# Patient Record
Sex: Male | Born: 1970
Health system: Southern US, Community
[De-identification: ages and names within clinical notes are randomized; demographics above are authoritative.]

## PROBLEM LIST (undated history)

## (undated) DIAGNOSIS — F319 Bipolar disorder, unspecified: Secondary | ICD-10-CM

## (undated) DIAGNOSIS — T7840XA Allergy, unspecified, initial encounter: Secondary | ICD-10-CM

## (undated) DIAGNOSIS — I10 Essential (primary) hypertension: Secondary | ICD-10-CM

## (undated) DIAGNOSIS — G4730 Sleep apnea, unspecified: Secondary | ICD-10-CM

## (undated) DIAGNOSIS — F410 Panic disorder [episodic paroxysmal anxiety] without agoraphobia: Secondary | ICD-10-CM

## (undated) HISTORY — DX: Panic disorder (episodic paroxysmal anxiety): F41.0

## (undated) HISTORY — DX: Allergy, unspecified, initial encounter: T78.40XA

## (undated) HISTORY — DX: Bipolar disorder, unspecified: F31.9

## (undated) HISTORY — DX: Essential (primary) hypertension: I10

## (undated) HISTORY — PX: ELBOW SURGERY: SHX618

## (undated) HISTORY — DX: Sleep apnea, unspecified: G47.30

---

## 2000-06-23 HISTORY — PX: SHOULDER SURGERY: SHX246

## 2004-11-22 ENCOUNTER — Ambulatory Visit (HOSPITAL_BASED_OUTPATIENT_CLINIC_OR_DEPARTMENT_OTHER): Admission: RE | Admit: 2004-11-22 | Discharge: 2004-11-22 | Payer: Self-pay | Admitting: Family Medicine

## 2004-11-24 ENCOUNTER — Ambulatory Visit: Payer: Self-pay | Admitting: Internal Medicine

## 2004-12-24 ENCOUNTER — Ambulatory Visit (HOSPITAL_BASED_OUTPATIENT_CLINIC_OR_DEPARTMENT_OTHER): Admission: RE | Admit: 2004-12-24 | Discharge: 2004-12-24 | Payer: Self-pay | Admitting: Family Medicine

## 2005-01-05 ENCOUNTER — Ambulatory Visit: Payer: Self-pay | Admitting: Internal Medicine

## 2013-06-21 ENCOUNTER — Ambulatory Visit (HOSPITAL_COMMUNITY): Payer: Commercial Indemnity | Attending: Cardiovascular Disease | Admitting: Cardiology

## 2013-06-21 ENCOUNTER — Other Ambulatory Visit (HOSPITAL_COMMUNITY): Payer: Self-pay | Admitting: Family Medicine

## 2013-06-21 ENCOUNTER — Encounter: Payer: Self-pay | Admitting: Cardiovascular Disease

## 2013-06-21 DIAGNOSIS — E785 Hyperlipidemia, unspecified: Secondary | ICD-10-CM | POA: Insufficient documentation

## 2013-06-21 DIAGNOSIS — I080 Rheumatic disorders of both mitral and aortic valves: Secondary | ICD-10-CM | POA: Insufficient documentation

## 2013-06-21 DIAGNOSIS — I079 Rheumatic tricuspid valve disease, unspecified: Secondary | ICD-10-CM | POA: Insufficient documentation

## 2013-06-21 DIAGNOSIS — I517 Cardiomegaly: Secondary | ICD-10-CM

## 2013-06-21 DIAGNOSIS — I10 Essential (primary) hypertension: Secondary | ICD-10-CM | POA: Insufficient documentation

## 2013-06-21 NOTE — Progress Notes (Signed)
Echo performed. 

## 2015-10-09 DIAGNOSIS — H00029 Hordeolum internum unspecified eye, unspecified eyelid: Secondary | ICD-10-CM | POA: Diagnosis not present

## 2016-03-03 DIAGNOSIS — M25521 Pain in right elbow: Secondary | ICD-10-CM | POA: Diagnosis not present

## 2016-03-03 DIAGNOSIS — M7021 Olecranon bursitis, right elbow: Secondary | ICD-10-CM | POA: Diagnosis not present

## 2016-04-24 DIAGNOSIS — M25551 Pain in right hip: Secondary | ICD-10-CM | POA: Diagnosis not present

## 2016-04-24 DIAGNOSIS — Z6835 Body mass index (BMI) 35.0-35.9, adult: Secondary | ICD-10-CM | POA: Diagnosis not present

## 2016-04-24 DIAGNOSIS — E669 Obesity, unspecified: Secondary | ICD-10-CM | POA: Diagnosis not present

## 2016-04-24 DIAGNOSIS — Z79899 Other long term (current) drug therapy: Secondary | ICD-10-CM | POA: Diagnosis not present

## 2016-04-24 DIAGNOSIS — M5431 Sciatica, right side: Secondary | ICD-10-CM | POA: Diagnosis not present

## 2016-04-24 DIAGNOSIS — M25721 Osteophyte, right elbow: Secondary | ICD-10-CM | POA: Diagnosis not present

## 2016-04-24 DIAGNOSIS — I10 Essential (primary) hypertension: Secondary | ICD-10-CM | POA: Diagnosis not present

## 2016-04-24 DIAGNOSIS — M7581 Other shoulder lesions, right shoulder: Secondary | ICD-10-CM | POA: Diagnosis not present

## 2016-04-24 DIAGNOSIS — M778 Other enthesopathies, not elsewhere classified: Secondary | ICD-10-CM | POA: Diagnosis not present

## 2016-04-24 DIAGNOSIS — M899 Disorder of bone, unspecified: Secondary | ICD-10-CM | POA: Diagnosis not present

## 2016-04-24 DIAGNOSIS — M25511 Pain in right shoulder: Secondary | ICD-10-CM | POA: Diagnosis not present

## 2016-04-24 DIAGNOSIS — S46311D Strain of muscle, fascia and tendon of triceps, right arm, subsequent encounter: Secondary | ICD-10-CM | POA: Diagnosis not present

## 2016-04-24 DIAGNOSIS — M7021 Olecranon bursitis, right elbow: Secondary | ICD-10-CM | POA: Diagnosis not present

## 2016-04-24 DIAGNOSIS — S46311A Strain of muscle, fascia and tendon of triceps, right arm, initial encounter: Secondary | ICD-10-CM | POA: Diagnosis not present

## 2016-04-24 DIAGNOSIS — Z9989 Dependence on other enabling machines and devices: Secondary | ICD-10-CM | POA: Diagnosis not present

## 2016-04-24 DIAGNOSIS — G4730 Sleep apnea, unspecified: Secondary | ICD-10-CM | POA: Diagnosis not present

## 2016-05-01 DIAGNOSIS — F3173 Bipolar disorder, in partial remission, most recent episode manic: Secondary | ICD-10-CM | POA: Diagnosis not present

## 2016-08-27 DIAGNOSIS — M25521 Pain in right elbow: Secondary | ICD-10-CM | POA: Diagnosis not present

## 2016-09-12 DIAGNOSIS — E7800 Pure hypercholesterolemia, unspecified: Secondary | ICD-10-CM | POA: Diagnosis not present

## 2016-09-12 DIAGNOSIS — G4733 Obstructive sleep apnea (adult) (pediatric): Secondary | ICD-10-CM | POA: Diagnosis not present

## 2016-09-12 DIAGNOSIS — I10 Essential (primary) hypertension: Secondary | ICD-10-CM | POA: Diagnosis not present

## 2016-09-12 DIAGNOSIS — F319 Bipolar disorder, unspecified: Secondary | ICD-10-CM | POA: Diagnosis not present

## 2016-09-12 DIAGNOSIS — R748 Abnormal levels of other serum enzymes: Secondary | ICD-10-CM | POA: Diagnosis not present

## 2016-09-17 ENCOUNTER — Other Ambulatory Visit: Payer: Self-pay | Admitting: Family Medicine

## 2016-09-17 DIAGNOSIS — R945 Abnormal results of liver function studies: Secondary | ICD-10-CM

## 2016-09-23 ENCOUNTER — Encounter: Payer: Self-pay | Admitting: Cardiovascular Disease

## 2016-09-25 ENCOUNTER — Ambulatory Visit
Admission: RE | Admit: 2016-09-25 | Discharge: 2016-09-25 | Disposition: A | Discharge status: Home / Self Care | Payer: BLUE CROSS/BLUE SHIELD | Source: Ambulatory Visit | Attending: Family Medicine | Admitting: Family Medicine

## 2016-09-25 DIAGNOSIS — K760 Fatty (change of) liver, not elsewhere classified: Secondary | ICD-10-CM | POA: Diagnosis not present

## 2016-09-25 DIAGNOSIS — R945 Abnormal results of liver function studies: Secondary | ICD-10-CM

## 2016-10-09 ENCOUNTER — Ambulatory Visit: Payer: Commercial Indemnity | Admitting: Cardiovascular Disease

## 2016-10-30 ENCOUNTER — Encounter: Payer: Self-pay | Admitting: Cardiovascular Disease

## 2016-11-19 NOTE — Progress Notes (Signed)
Cardiology Office Note   Date:  11/21/2016   ID:  Joseph Kirk, DOB 1971/05/20, MRN [id number]  PCP:  Blair Heys, MD  Cardiologist:   Charlton Haws, MD   Chief Complaint  Patient presents with  . Establish Care      History of Present Illness: Joseph Kirk is a 46 y.o. male who presents for evaluation of CAD. CRF;s HTN and family history  Grandfather with premature CAD Has OSA on CPAP since 2006.  Bipolar.  Notes indicate history  Of LVH   Echo 06/21/13 reviewed:  EF 50-55%   Reviewed echo from 05/2013 done for cardiomegaly EF 50-55% mild MR And mild LAE mild basal septal hypertrophy 14.6 mm   Labs from Dr   Manus Gunning reviewed:  LDL 97 TC 166 Trig: 185 Cr 1.2 K 4.1 AST 52 (39) ALT 143 ( 52)   He does local truck driving. Lifts weights Denies steroid use. Occasional SSCP with lifting that sounds more muscular but is exertional. Pain more sharp in nature and has had it for over a year Father died of CHF. Paternal grandfather had premature CAD. Two brothers and a sister ok  Eats well low carb. Non smoker non drinker  Past Medical History:  Diagnosis Date  . Allergy   . Bipolar disorder (HCC)   . Hypertension   . Panic attacks   . Sleep apnea     Past Surgical History:  Procedure Laterality Date  . ELBOW SURGERY Left   . SHOULDER SURGERY  2002     Current Outpatient Prescriptions  Medication Sig Dispense Refill  . aluminum chloride (HYPERCARE) 20 % external solution Apply topically at bedtime. USE AS DIRECTED    . diltiazem (CARDIZEM CD) 180 MG 24 hr capsule Take 180 mg by mouth daily.    . divalproex (DEPAKOTE ER) 500 MG 24 hr tablet Take 2,500 mg by mouth at bedtime.    Marland Kitchen losartan (COZAAR) 100 MG tablet Take 100 mg by mouth daily.    . quinapril-hydrochlorothiazide (ACCURETIC) 20-12.5 MG tablet Take 1 tablet by mouth daily.    . sildenafil (VIAGRA) 100 MG tablet Take 100 mg by mouth daily as needed. For ED     No current facility-administered medications  for this visit.     Allergies:   Viagra [sildenafil citrate]    Social History:  The patient  reports that he has never smoked. He has never used smokeless tobacco. He reports that he does not drink alcohol or use drugs.   Family History:   Grandfather with premature CAD    ROS:  Please see the history of present illness.   Otherwise, review of systems are positive for none.   All other systems are reviewed and negative.    PHYSICAL EXAM: VS:  BP 130/70   Pulse 68   Ht 6' 0.5" (1.842 m)   Wt 277 lb (125.6 kg)   SpO2 98%   BMI 37.05 kg/m  , BMI Body mass index is 37.05 kg/m. Affect appropriate Healthy:  appears stated age HEENT: normal Neck supple with no adenopathy JVP normal no bruits no thyromegaly Lungs clear with no wheezing and good diaphragmatic motion Heart:  S1/S2 no murmur, no rub, gallop or click PMI normal Abdomen: benighn, BS positve, no tenderness, no AAA no bruit.  No HSM or HJR Distal pulses intact with no bruits No edema Neuro non-focal Skin warm and dry No muscular weakness    EKG:  SR LVH inferolateral T wave inversion  Recent Labs: No results found for requested labs within last 8760 hours.    Lipid Panel No results found for: CHOL, TRIG, HDL, CHOLHDL, VLDL, LDLCALC, LDLDIRECT    Wt Readings from Last 3 Encounters:  11/21/16 277 lb (125.6 kg)      Other studies Reviewed: Additional studies/ records that were reviewed today include: Notes Dr Richardo PriestEhnger old echo ECG And labs .    ASSESSMENT AND PLAN:  1.  Cardiomegaly ? releated to weight lifting and HTN f/u echo known LVH  2. MR mild no murmur on exam echo no need for SBE 3. Family history CAD/Chest pain:  Abnormal ECG best test to risk stratify is Calcium score with cardiac CTA will try to do on new scanner 7/12 as he is large 4. HTN  Well controlled.  Continue current medications and low sodium Dash type diet.   5. Bipolar stable no overt manic episodes  6. Elevated LFT;s  F/u  primary may be important if calcium score is high and needs statin   Current medicines are reviewed at length with the patient today.  The patient does not have concerns regarding medicines.  The following changes have been made:  no change  Labs/ tests ordered today include: Echo Cardiac CTA  Orders Placed This Encounter  Procedures  . CT CORONARY MORPH W/CTA COR W/SCORE W/CA W/CM &/OR WO/CM  . EKG 12-Lead  . ECHOCARDIOGRAM COMPLETE     Disposition:   FU with in a year      Signed, Charlton HawsPeter Robbie Rideaux, MD  11/21/2016 9:30 AM    [facility] [address], [place], [place]  [address] Phone: [phone]; Fax: [phone]

## 2016-11-21 ENCOUNTER — Ambulatory Visit (INDEPENDENT_AMBULATORY_CARE_PROVIDER_SITE_OTHER): Payer: BLUE CROSS/BLUE SHIELD | Admitting: Cardiovascular Disease

## 2016-11-21 ENCOUNTER — Encounter: Payer: Self-pay | Admitting: Cardiovascular Disease

## 2016-11-21 ENCOUNTER — Encounter (INDEPENDENT_AMBULATORY_CARE_PROVIDER_SITE_OTHER): Payer: Self-pay

## 2016-11-21 ENCOUNTER — Other Ambulatory Visit: Payer: Self-pay

## 2016-11-21 VITALS — BP 130/70 | HR 68 | Ht 72.5 in | Wt 277.0 lb

## 2016-11-21 DIAGNOSIS — Z8249 Family history of ischemic heart disease and other diseases of the circulatory system: Secondary | ICD-10-CM

## 2016-11-21 DIAGNOSIS — R9431 Abnormal electrocardiogram [ECG] [EKG]: Secondary | ICD-10-CM

## 2016-11-21 DIAGNOSIS — Z7689 Persons encountering health services in other specified circumstances: Secondary | ICD-10-CM | POA: Diagnosis not present

## 2016-11-21 DIAGNOSIS — R079 Chest pain, unspecified: Secondary | ICD-10-CM

## 2016-11-21 DIAGNOSIS — R0789 Other chest pain: Secondary | ICD-10-CM | POA: Diagnosis not present

## 2016-11-21 DIAGNOSIS — I517 Cardiomegaly: Secondary | ICD-10-CM

## 2016-11-21 NOTE — Progress Notes (Unsigned)
Ordered CT for 01/01/17

## 2016-11-21 NOTE — Patient Instructions (Addendum)
Medication Instructions:  Your physician recommends that you continue on your current medications as directed. Please refer to the Current Medication list given to you today.  Labwork: NONE  Testing/Procedures: Your physician has requested that you have an echocardiogram. Echocardiography is a painless test that uses sound waves to create images of your heart. It provides your doctor with information about the size and shape of your heart and how well your heart's chambers and valves are working. This procedure takes approximately one hour. There are no restrictions for this procedure.  Your physician has requested that you have cardiac CT on 01/01/17 (See Jasmine DecemberSharon at Check Out). Cardiac computed tomography (CT) is a painless test that uses an x-ray machine to take clear, detailed pictures of your heart. For further information please visit [email or web address] Please follow instruction sheet as given.   Follow-Up: Your physician wants you to follow-up in: 12 months with Dr. Eden EmmsNishan. You will receive a reminder letter in the mail two months in advance. If you don't receive a letter, please call our office to schedule the follow-up appointment.   If you need a refill on your cardiac medications before your next appointment, please call your pharmacy.

## 2016-11-28 ENCOUNTER — Encounter: Payer: Self-pay | Admitting: Family Medicine

## 2016-12-04 ENCOUNTER — Ambulatory Visit (HOSPITAL_COMMUNITY): Payer: BLUE CROSS/BLUE SHIELD | Attending: Internal Medicine

## 2016-12-04 ENCOUNTER — Other Ambulatory Visit: Payer: Self-pay

## 2016-12-04 DIAGNOSIS — I051 Rheumatic mitral insufficiency: Secondary | ICD-10-CM | POA: Diagnosis not present

## 2016-12-04 DIAGNOSIS — I517 Cardiomegaly: Secondary | ICD-10-CM | POA: Diagnosis not present

## 2016-12-04 DIAGNOSIS — I5030 Unspecified diastolic (congestive) heart failure: Secondary | ICD-10-CM | POA: Insufficient documentation

## 2016-12-08 ENCOUNTER — Encounter: Payer: Self-pay | Admitting: Cardiovascular Disease

## 2017-01-01 ENCOUNTER — Ambulatory Visit (HOSPITAL_COMMUNITY)
Admission: RE | Admit: 2017-01-01 | Discharge: 2017-01-01 | Disposition: A | Discharge status: Home / Self Care | Payer: BLUE CROSS/BLUE SHIELD | Source: Ambulatory Visit | Attending: Cardiovascular Disease | Admitting: Cardiovascular Disease

## 2017-01-01 DIAGNOSIS — R9431 Abnormal electrocardiogram [ECG] [EKG]: Secondary | ICD-10-CM | POA: Diagnosis not present

## 2017-01-01 DIAGNOSIS — K760 Fatty (change of) liver, not elsewhere classified: Secondary | ICD-10-CM | POA: Insufficient documentation

## 2017-01-01 DIAGNOSIS — R079 Chest pain, unspecified: Secondary | ICD-10-CM | POA: Diagnosis not present

## 2017-01-01 MED ORDER — IOPAMIDOL (ISOVUE-370) INJECTION 76%
INTRAVENOUS | Status: AC
Start: 2017-01-01 — End: 2017-01-01
  Administered 2017-01-01: 100 mL
  Filled 2017-01-01: qty 100

## 2017-01-01 MED ORDER — METOPROLOL TARTRATE 5 MG/5ML IV SOLN
INTRAVENOUS | Status: AC
Start: 2017-01-01 — End: 2017-01-01
  Administered 2017-01-01: 5 mg
  Filled 2017-01-01: qty 5

## 2017-01-01 MED ORDER — METOPROLOL TARTRATE 5 MG/5ML IV SOLN
INTRAVENOUS | Status: AC
  Administered 2017-01-01: 10 mg
  Filled 2017-01-01: qty 5

## 2017-04-30 DIAGNOSIS — J0140 Acute pansinusitis, unspecified: Secondary | ICD-10-CM | POA: Diagnosis not present

## 2017-06-03 DIAGNOSIS — L03116 Cellulitis of left lower limb: Secondary | ICD-10-CM | POA: Diagnosis not present

## 2017-06-10 DIAGNOSIS — M79645 Pain in left finger(s): Secondary | ICD-10-CM | POA: Diagnosis not present

## 2017-06-10 DIAGNOSIS — M79644 Pain in right finger(s): Secondary | ICD-10-CM | POA: Diagnosis not present

## 2017-06-19 DIAGNOSIS — M79645 Pain in left finger(s): Secondary | ICD-10-CM | POA: Diagnosis not present

## 2017-06-19 DIAGNOSIS — M25521 Pain in right elbow: Secondary | ICD-10-CM | POA: Diagnosis not present

## 2017-06-19 DIAGNOSIS — M79644 Pain in right finger(s): Secondary | ICD-10-CM | POA: Diagnosis not present

## 2017-08-17 DIAGNOSIS — R030 Elevated blood-pressure reading, without diagnosis of hypertension: Secondary | ICD-10-CM | POA: Diagnosis not present

## 2017-08-17 DIAGNOSIS — M5431 Sciatica, right side: Secondary | ICD-10-CM | POA: Diagnosis not present

## 2017-09-24 DIAGNOSIS — Z125 Encounter for screening for malignant neoplasm of prostate: Secondary | ICD-10-CM | POA: Diagnosis not present

## 2017-09-24 DIAGNOSIS — F319 Bipolar disorder, unspecified: Secondary | ICD-10-CM | POA: Diagnosis not present

## 2017-09-24 DIAGNOSIS — I10 Essential (primary) hypertension: Secondary | ICD-10-CM | POA: Diagnosis not present

## 2017-09-24 DIAGNOSIS — E781 Pure hyperglyceridemia: Secondary | ICD-10-CM | POA: Diagnosis not present

## 2017-09-24 DIAGNOSIS — G4733 Obstructive sleep apnea (adult) (pediatric): Secondary | ICD-10-CM | POA: Diagnosis not present

## 2017-09-24 DIAGNOSIS — N529 Male erectile dysfunction, unspecified: Secondary | ICD-10-CM | POA: Diagnosis not present

## 2018-02-08 IMAGING — CT CT HEART MORP W/ CTA COR W/ SCORE W/ CA W/CM &/OR W/O CM
4 of 7 series · 8 of 20 positions shown, 9 images · IV contrast (APPLIED)
Comparison: No priors.

EXAM:
OVER-READ INTERPRETATION  CT CHEST

The following report is an over-read performed by radiologist Dr.
over-read does not include interpretation of cardiac or coronary
anatomy or pathology. The cardiac CTA interpretation by the
cardiologist is attached.
CLINICAL DATA: Chest pain
Cardiac CTA
MEDICATIONS:
Sub lingual nitro.  4 ug and lopressor 15mg
TECHNIQUE: The patient was scanned on a Siemens Force [REDACTED]ice scanner. Gantry
rotation speed was 250 msecs. Collimation was .6 mm. A 100 kV
prospective scan was triggered in the ascending thoracic aorta at
140 HU's Full mA was used between 35% and 75% of the R-R interval.
Average HR during the scan was 68 bpm. The 3D data set was
interpreted on a dedicated work station using MPR, MIP and VRT
modes. A total of 80cc of contrast was used.

[Series 6: best diast 73 % · axial · 0.41mm/px · z∈[+1762,+1826]mm · 2 of 478 slices shown, 3 images]
[im 160/478  vessel]
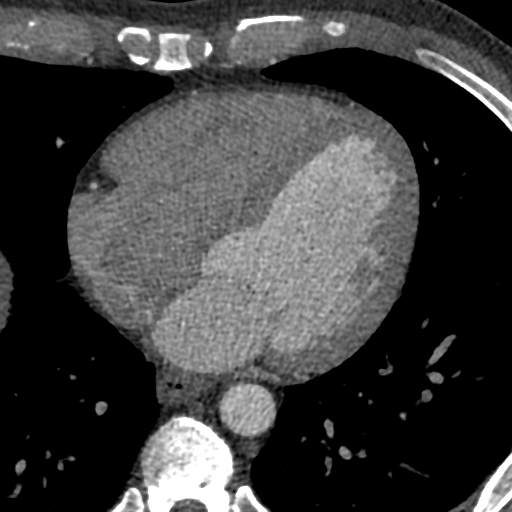
[im 160/478  lung]
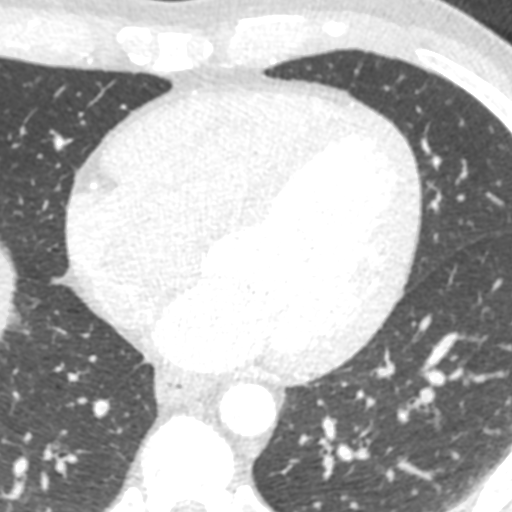
[im 319/478  vessel]
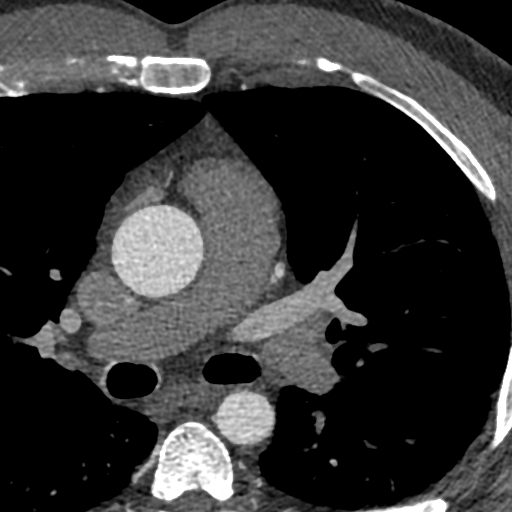

[Series 7: best syst 44 % · axial · 0.41mm/px · z∈[+1762,+1826]mm · 2 of 478 slices shown]
[im 160/478  vessel]
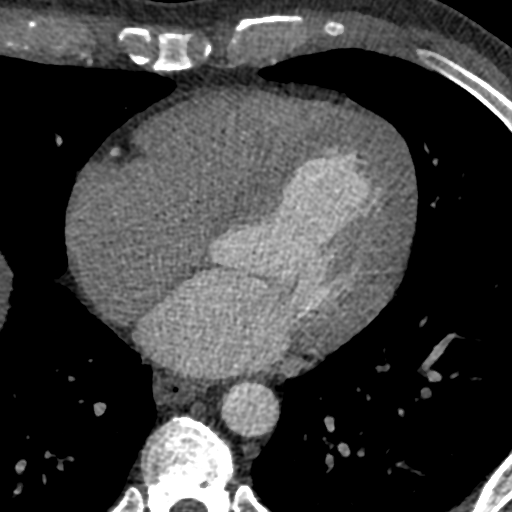
[im 319/478  vessel]
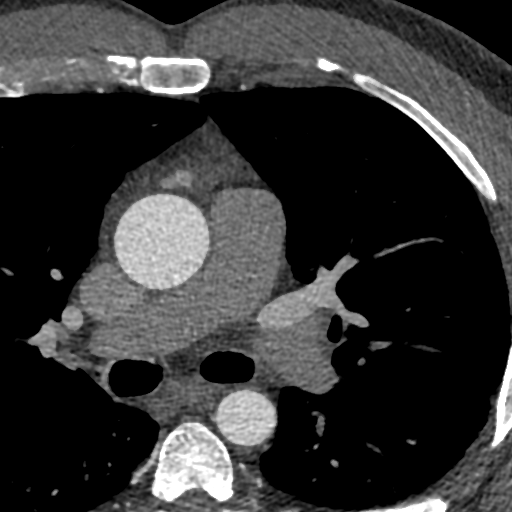

[Series 8: diast sharp 73 % · axial · 0.41mm/px · z∈[+1762,+1826]mm · 2 of 478 slices shown]
[im 160/478  lung]
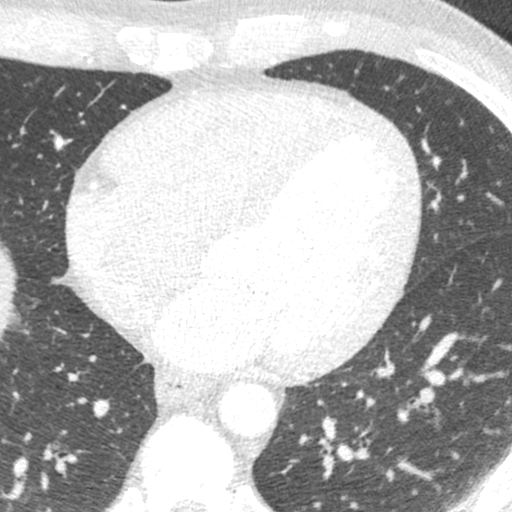
[im 319/478  lung]
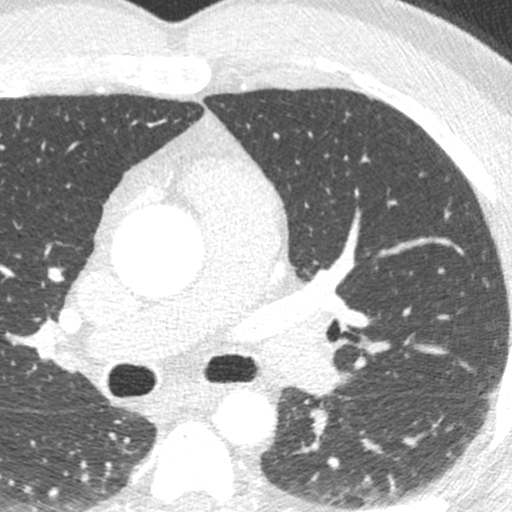

[Series 9: syst sharp 44 % · axial · 0.41mm/px · z∈[+1762,+1826]mm · 2 of 478 slices shown]
[im 160/478  lung]
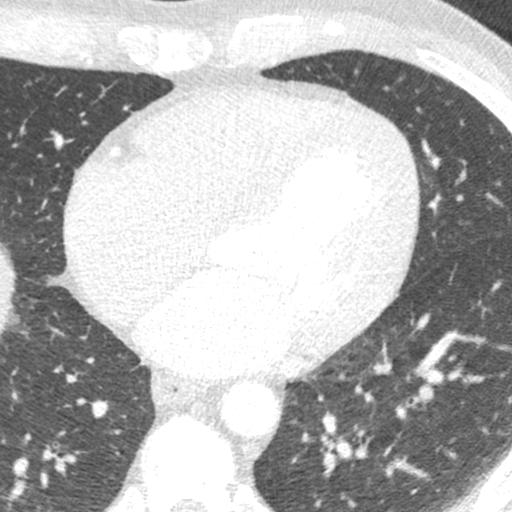
[im 319/478  lung]
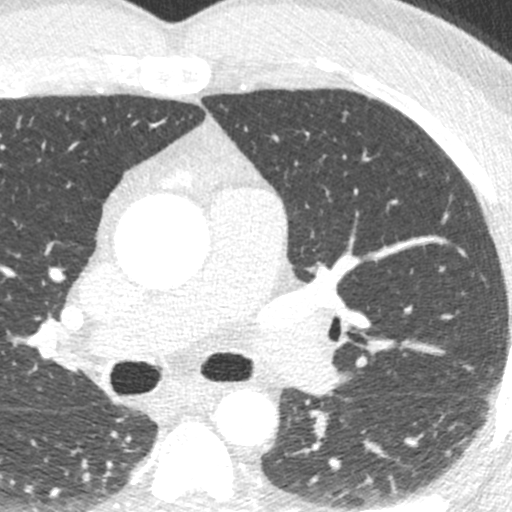

[8 of 20 positions shown; findings below may reference images not displayed]

FINDINGS: Within the visualized portions of the thorax there is no acute
consolidative airspace disease, no pleural effusions, no
pneumothorax, no suspicious-appearing pulmonary nodules or masses
and no lymphadenopathy. Visualized portions of the upper abdomen
demonstrate diffuse low attenuation throughout the visualized liver
indicative of hepatic steatosis. There are no aggressive appearing
lytic or blastic lesions noted in the visualized portions of the
skeleton.
IMPRESSION: 1. Severe hepatic steatosis.
FINDINGS: Non-cardiac: See separate report from [REDACTED]. No
significant findings on limited lung and soft tissue windows.

Calcium Score: 4 small punctate area in mid LAD and distal
circumflex

Coronary Arteries: Right dominant with no anomalies

LM: Normal

LAD:  Less than 20% calcified disease in mid LAD at D1 take off

D1: Normal

IM:  Normal

Circumflex: Normal

OM1: Normal

OM2: Normal

RCA:  Dominant and normal

PDA: Normal

PLA:  Normal
IMPRESSION: 1) Calcium score 4.  79 th percentile for age and sex

2) Essentially normal right dominant coronary arteries with no
significant obstructive disease and mild atherosclerosis of mid LAD
and distal circumflex

Opony Danusia

## 2018-03-03 DIAGNOSIS — H6122 Impacted cerumen, left ear: Secondary | ICD-10-CM | POA: Diagnosis not present

## 2018-05-31 DIAGNOSIS — M79659 Pain in unspecified thigh: Secondary | ICD-10-CM | POA: Diagnosis not present

## 2018-06-02 DIAGNOSIS — M79605 Pain in left leg: Secondary | ICD-10-CM | POA: Diagnosis not present

## 2018-07-05 ENCOUNTER — Encounter: Payer: Self-pay | Admitting: Emergency Medicine

## 2018-07-05 DIAGNOSIS — F319 Bipolar disorder, unspecified: Secondary | ICD-10-CM | POA: Insufficient documentation

## 2018-07-24 DIAGNOSIS — H6120 Impacted cerumen, unspecified ear: Secondary | ICD-10-CM | POA: Diagnosis not present

## 2018-08-13 ENCOUNTER — Ambulatory Visit: Payer: Self-pay | Admitting: Psychiatry

## 2018-08-25 ENCOUNTER — Ambulatory Visit: Payer: Self-pay | Admitting: Psychiatry

## 2018-08-31 ENCOUNTER — Encounter: Payer: Self-pay | Admitting: Psychiatry

## 2018-08-31 ENCOUNTER — Ambulatory Visit: Payer: BLUE CROSS/BLUE SHIELD | Admitting: Psychiatry

## 2018-08-31 DIAGNOSIS — F319 Bipolar disorder, unspecified: Secondary | ICD-10-CM | POA: Diagnosis not present

## 2018-08-31 MED ORDER — DIVALPROEX SODIUM ER 500 MG PO TB24: 2500.0000 mg | ORAL_TABLET | Freq: Every day | ORAL | 3 refills | Status: DC

## 2018-08-31 NOTE — Progress Notes (Signed)
Joseph Kirk [id number] 08-14-1970 48 y.o.  Subjective:   Patient ID:  Joseph Kirk is a 48 y.o. (DOB 1970/11/07) male.  Chief Complaint:  Chief Complaint  Patient presents with  . Follow-up    Medication Management   Last seen February HPI Joseph Kirk presents to the office today for follow-up of bipolar disorder.  Doing fine , same old thing.  No unusual mood swings.  Patient reports stable mood and denies depressed or irritable moods.  Patient denies any recent difficulty with anxiety.  Patient denies difficulty with sleep initiation or maintenance. Denies appetite disturbance.  But can only sleep 6-7 hours.  Patient reports that energy and motivation have been good.  Patient denies any difficulty with concentration.  Patient denies any suicidal ideation.   Review of Systems:  Review of Systems  Neurological: Negative for tremors and weakness.  Psychiatric/Behavioral: Negative for agitation, behavioral problems, confusion, decreased concentration, dysphoric mood, hallucinations, self-injury, sleep disturbance and suicidal ideas. The patient is not nervous/anxious and is not hyperactive.     Medications: I have reviewed the patient's current medications.  Current Outpatient Medications  Medication Sig Dispense Refill  . aluminum chloride (HYPERCARE) 20 % external solution Apply topically at bedtime. USE AS DIRECTED    . diltiazem (CARDIZEM CD) 180 MG 24 hr capsule Take 180 mg by mouth daily.    . divalproex (DEPAKOTE ER) 500 MG 24 hr tablet Take 5 tablets (2,500 mg total) by mouth at bedtime. 450 tablet 3  . losartan (COZAAR) 100 MG tablet Take 100 mg by mouth daily.    . quinapril-hydrochlorothiazide (ACCURETIC) 20-12.5 MG tablet Take 1 tablet by mouth daily.    . sildenafil (VIAGRA) 100 MG tablet Take 100 mg by mouth daily as needed. For ED     No current facility-administered medications for this visit.     Medication Side Effects: None  Allergies:  Allergies   Allergen Reactions  . Viagra [Sildenafil Citrate] Other (See Comments)    HEADACHES    Past Medical History:  Diagnosis Date  . Allergy   . Bipolar disorder (HCC)   . Hypertension   . Panic attacks   . Sleep apnea     Family History  Problem Relation Age of Onset  . Heart disease Father   . Heart failure Father   . Hypertension Father   . Heart disease Paternal Grandfather   . Heart failure Paternal Grandfather   . Heart attack Paternal Grandfather   . Hypertension Paternal Grandfather   . Hypertension Sister     Social History   Socioeconomic History  . Marital status: Single    Spouse name: Not on file  . Number of children: Not on file  . Years of education: Not on file  . Highest education level: Not on file  Occupational History  . Not on file  Social Needs  . Financial resource strain: Not on file  . Food insecurity:    Worry: Not on file    Inability: Not on file  . Transportation needs:    Medical: Not on file    Non-medical: Not on file  Tobacco Use  . Smoking status: Never Smoker  . Smokeless tobacco: Never Used  Substance and Sexual Activity  . Alcohol use: No  . Drug use: No  . Sexual activity: Not on file  Lifestyle  . Physical activity:    Days per week: Not on file    Minutes per session: Not on file  . Stress: Not  on file  Relationships  . Social connections:    Talks on phone: Not on file    Gets together: Not on file    Attends religious service: Not on file    Active member of club or organization: Not on file    Attends meetings of clubs or organizations: Not on file    Relationship status: Not on file  . Intimate partner violence:    Fear of current or ex partner: Not on file    Emotionally abused: Not on file    Physically abused: Not on file    Forced sexual activity: Not on file  Other Topics Concern  . Not on file  Social History Narrative  . Not on file    Past Medical History, Surgical history, Social history, and  Family history were reviewed and updated as appropriate.   Still works FT.  Please see review of systems for further details on the patient's review from today.   Objective:   Physical Exam:  There were no vitals taken for this visit.  Physical Exam Constitutional:      General: He is not in acute distress.    Appearance: He is well-developed.  Musculoskeletal:        General: No deformity.  Neurological:     Mental Status: He is alert and oriented to person, place, and time.     Coordination: Coordination normal.  Psychiatric:        Attention and Perception: Attention normal. He is attentive.        Mood and Affect: Mood normal. Mood is not anxious or depressed. Affect is not labile, blunt, angry or inappropriate.        Speech: Speech normal.        Behavior: Behavior normal.        Thought Content: Thought content normal. Thought content does not include homicidal or suicidal ideation. Thought content does not include homicidal or suicidal plan.        Cognition and Memory: Cognition normal.        Judgment: Judgment normal.     Comments: Insight is good. Animated style.     Lab Review:  No results found for: NA, K, CL, CO2, GLUCOSE, BUN, CREATININE, CALCIUM, PROT, ALBUMIN, AST, ALT, ALKPHOS, BILITOT, GFRNONAA, GFRAA  No results found for: WBC, RBC, HGB, HCT, PLT, MCV, MCH, MCHC, RDW, LYMPHSABS, MONOABS, EOSABS, BASOSABS  No results found for: POCLITH, LITHIUM   No results found for: PHENYTOIN, PHENOBARB, VALPROATE, CBMZ   .res Assessment: Plan:    Bipolar I disorder (HCC)   Brand medically necessary.  Stable for years.  No changes indicated.  Disc SE.  FU 1 years  Meredith Staggers, MD, DFAPA   Please see After Visit Summary for patient specific instructions.  No future appointments.  No orders of the defined types were placed in this encounter.     -------------------------------

## 2018-09-27 DIAGNOSIS — N529 Male erectile dysfunction, unspecified: Secondary | ICD-10-CM | POA: Diagnosis not present

## 2018-09-27 DIAGNOSIS — E781 Pure hyperglyceridemia: Secondary | ICD-10-CM | POA: Diagnosis not present

## 2018-09-27 DIAGNOSIS — G4733 Obstructive sleep apnea (adult) (pediatric): Secondary | ICD-10-CM | POA: Diagnosis not present

## 2018-09-27 DIAGNOSIS — I10 Essential (primary) hypertension: Secondary | ICD-10-CM | POA: Diagnosis not present

## 2018-09-27 DIAGNOSIS — F319 Bipolar disorder, unspecified: Secondary | ICD-10-CM | POA: Diagnosis not present

## 2018-12-04 DIAGNOSIS — F4321 Adjustment disorder with depressed mood: Secondary | ICD-10-CM | POA: Diagnosis not present

## 2018-12-13 ENCOUNTER — Telehealth: Payer: Self-pay | Admitting: Psychiatry

## 2018-12-13 NOTE — Telephone Encounter (Signed)
Patient lost brother and need to get in to see you and he's also having problems being chlostraphobic, please advise pt is off work this week

## 2018-12-13 NOTE — Telephone Encounter (Signed)
We are short staffed at the moment and I apologize.  There is no way I am going to be able to work him in this week.  Obviously grief symptoms are normal at this point.  Does he need my help with something specific?  He may need grief counseling and we can refer him for that if that is what he needs.

## 2018-12-14 NOTE — Telephone Encounter (Signed)
Left voice mail to call back 

## 2018-12-20 DIAGNOSIS — Z03818 Encounter for observation for suspected exposure to other biological agents ruled out: Secondary | ICD-10-CM | POA: Diagnosis not present

## 2018-12-20 DIAGNOSIS — Z20828 Contact with and (suspected) exposure to other viral communicable diseases: Secondary | ICD-10-CM | POA: Diagnosis not present

## 2018-12-20 NOTE — Telephone Encounter (Signed)
I don't know any specific counselors in [place] to recommend.  Hospice might be a good place to start.  RE: masks.  We are not providing letters to excuse mask wearing for public health and liability reasons.  He could consider wearing 1 of the clear face shields with a headband as those are less confining.  Be aware while this is "mandatory" you do not have to have prove of an exception to avoid using a mask.  In other words you are not going to be fined or arrested for not wearing a mask.

## 2018-12-21 NOTE — Telephone Encounter (Signed)
If the patient calls back about these concerns please express that I am sorry for his loss.  We are not giving letters of excuse for the mandate to wear masks.  Individuals can claim to have a mental health reason not to wear a mask and they will not be penalized.  My suggestion is that he use a headband that has the clear shield barrier in front of it.  That generally does not increase a sense of claustrophobia nor anxiety.

## 2018-12-22 NOTE — Telephone Encounter (Signed)
Tried to reach pt a couple times but his mail box is full.

## 2019-01-20 DIAGNOSIS — L719 Rosacea, unspecified: Secondary | ICD-10-CM | POA: Diagnosis not present

## 2019-02-17 DIAGNOSIS — J309 Allergic rhinitis, unspecified: Secondary | ICD-10-CM | POA: Diagnosis not present

## 2019-02-17 DIAGNOSIS — J3489 Other specified disorders of nose and nasal sinuses: Secondary | ICD-10-CM | POA: Diagnosis not present

## 2019-03-03 DIAGNOSIS — L719 Rosacea, unspecified: Secondary | ICD-10-CM | POA: Diagnosis not present

## 2019-04-14 DIAGNOSIS — L719 Rosacea, unspecified: Secondary | ICD-10-CM | POA: Diagnosis not present

## 2019-04-14 DIAGNOSIS — D485 Neoplasm of uncertain behavior of skin: Secondary | ICD-10-CM | POA: Diagnosis not present

## 2019-04-14 DIAGNOSIS — L408 Other psoriasis: Secondary | ICD-10-CM | POA: Diagnosis not present

## 2019-08-31 ENCOUNTER — Encounter: Payer: Self-pay | Admitting: Psychiatry

## 2019-08-31 ENCOUNTER — Ambulatory Visit (INDEPENDENT_AMBULATORY_CARE_PROVIDER_SITE_OTHER): Payer: BC Managed Care – PPO | Admitting: Psychiatry

## 2019-08-31 DIAGNOSIS — F319 Bipolar disorder, unspecified: Secondary | ICD-10-CM

## 2019-08-31 MED ORDER — DIVALPROEX SODIUM ER 500 MG PO TB24
2500.0000 mg | ORAL_TABLET | Freq: Every day | ORAL | 3 refills | Status: DC
Start: 2019-08-31 — End: 2020-06-04

## 2019-08-31 NOTE — Progress Notes (Signed)
Joseph Kirk [id number] 1970-10-07 49 y.o.   Virtual Visit via WebEX  I connected with pt by WebEx and verified that I am speaking with the correct person using two identifiers.   I discussed the limitations, risks, security and privacy concerns of performing an evaluation and management service by [facility] and the availability of in person appointments. I also discussed with the patient that there may be a patient responsible charge related to this service. The patient expressed understanding and agreed to proceed.  I discussed the assessment and treatment plan with the patient. The patient was provided an opportunity to ask questions and all were answered. The patient agreed with the plan and demonstrated an understanding of the instructions.   The patient was advised to call back or seek an in-person evaluation if the symptoms worsen or if the condition fails to improve as anticipated.  I provided 30 minutes of video time during this encounter. The call started at 900 and ended at 9:15. The patient was located at home and the provider was located office.   Subjective:   Patient ID:  Joseph Kirk is a 49 y.o. (DOB 1971-05-24) male.  Chief Complaint:  Chief Complaint  Patient presents with  . Follow-up    Medication Management  . Other    Bipolar 1 disorder  . claustrophobia    re: mask wearing    HPI Joseph Kirk presents to the office today for follow-up of bipolar disorder.   Last seen March 2020.  He was doing well and no meds were changed.  He called back in 2020-06-23shortly after the death of his brother.  Covid outbreak was in progress and he reported being claustrophobic about having to wear a mask.  This was discussed but no meds were changed.  Doing OK now but past year hell.  B was best friend and it's not been good .  B's death was unexpected and healthy person.   Disc grief.  More accepting now than before.  Went through period of crying and may always happen.    Working on gratitude.    Doing fine , same old thing.  No unusual mood swings.  Patient reports stable mood and denies depressed or irritable moods.  Patient denies any recent difficulty with anxiety.  Patient denies difficulty with sleep initiation or maintenance. Denies appetite disturbance.  But can only sleep 6-7 hours.  Patient reports that energy and motivation have been good.  Patient denies any difficulty with concentration.  Patient denies any suicidal ideation.  Pt is truck driver.  Past Psychiatric Medication Trials:  Depakote brand necessary, Lamictal, risperidone  Review of Systems:  Review of Systems  Neurological: Negative for tremors and weakness.  Psychiatric/Behavioral: Negative for agitation, behavioral problems, confusion, decreased concentration, dysphoric mood, hallucinations, self-injury, sleep disturbance and suicidal ideas. The patient is not nervous/anxious and is not hyperactive.     Medications: I have reviewed the patient's current medications.  Current Outpatient Medications  Medication Sig Dispense Refill  . aluminum chloride (HYPERCARE) 20 % external solution Apply topically at bedtime. USE AS DIRECTED    . diltiazem (CARDIZEM CD) 180 MG 24 hr capsule Take 180 mg by mouth daily.    . divalproex (DEPAKOTE ER) 500 MG 24 hr tablet Take 5 tablets (2,500 mg total) by mouth at bedtime. 450 tablet 3  . losartan (COZAAR) 100 MG tablet Take 100 mg by mouth daily.    . quinapril-hydrochlorothiazide (ACCURETIC) 20-12.5 MG tablet Take 1 tablet by mouth  daily.    . sildenafil (VIAGRA) 100 MG tablet Take 100 mg by mouth daily as needed. For ED     No current facility-administered medications for this visit.    Medication Side Effects: None  Allergies:  Allergies  Allergen Reactions  . Viagra [Sildenafil Citrate] Other (See Comments)    HEADACHES    Past Medical History:  Diagnosis Date  . Allergy   . Bipolar disorder ([place])   . Hypertension   . Panic attacks    . Sleep apnea     Family History  Problem Relation Age of Onset  . Heart disease Father   . Heart failure Father   . Hypertension Father   . Heart disease Paternal Grandfather   . Heart failure Paternal Grandfather   . Heart attack Paternal Grandfather   . Hypertension Paternal Grandfather   . Hypertension Sister     Social History   Socioeconomic History  . Marital status: Single    Spouse name: Not on file  . Number of children: Not on file  . Years of education: Not on file  . Highest education level: Not on file  Occupational History  . Not on file  Tobacco Use  . Smoking status: Never Smoker  . Smokeless tobacco: Never Used  Substance and Sexual Activity  . Alcohol use: No  . Drug use: No  . Sexual activity: Not on file  Other Topics Concern  . Not on file  Social History Narrative  . Not on file   Social Determinants of Health   Financial Resource Strain:   . Difficulty of Paying Living Expenses: Not on file  Food Insecurity:   . Worried About Charity fundraiser in the Last Year: Not on file  . Ran Out of Food in the Last Year: Not on file  Transportation Needs:   . Lack of Transportation (Medical): Not on file  . Lack of Transportation (Non-Medical): Not on file  Physical Activity:   . Days of Exercise per Week: Not on file  . Minutes of Exercise per Session: Not on file  Stress:   . Feeling of Stress : Not on file  Social Connections:   . Frequency of Communication with Friends and Family: Not on file  . Frequency of Social Gatherings with Friends and Family: Not on file  . Attends Religious Services: Not on file  . Active Member of Clubs or Organizations: Not on file  . Attends Archivist Meetings: Not on file  . Marital Status: Not on file  Intimate Partner Violence:   . Fear of Current or Ex-Partner: Not on file  . Emotionally Abused: Not on file  . Physically Abused: Not on file  . Sexually Abused: Not on file    Past Medical  History, Surgical history, Social history, and Family history were reviewed and updated as appropriate.   Still works FT.  Please see review of systems for further details on the patient's review from today.   Objective:   Physical Exam:  There were no vitals taken for this visit.  Physical Exam Constitutional:      General: He is not in acute distress.    Appearance: He is well-developed.  Musculoskeletal:        General: No deformity.  Neurological:     Mental Status: He is alert and oriented to person, place, and time.     Coordination: Coordination normal.  Psychiatric:        Attention and  Perception: Attention normal. He is attentive.        Mood and Affect: Mood normal. Mood is not anxious or depressed. Affect is not labile, blunt, angry or inappropriate.        Speech: Speech normal.        Behavior: Behavior normal.        Thought Content: Thought content normal. Thought content does not include homicidal or suicidal ideation. Thought content does not include homicidal or suicidal plan.        Cognition and Memory: Cognition normal.        Judgment: Judgment normal.     Comments: Insight is good. Animated style.     Lab Review:  No results found for: NA, K, CL, CO2, GLUCOSE, BUN, CREATININE, CALCIUM, PROT, ALBUMIN, AST, ALT, ALKPHOS, BILITOT, GFRNONAA, GFRAA  No results found for: WBC, RBC, HGB, HCT, PLT, MCV, MCH, MCHC, RDW, LYMPHSABS, MONOABS, EOSABS, BASOSABS  No results found for: POCLITH, LITHIUM   No results found for: PHENYTOIN, PHENOBARB, VALPROATE, CBMZ   .res Assessment: Plan:    Bipolar I disorder (HCC) - Plan: divalproex (DEPAKOTE ER) 500 MG 24 hr tablet   Brand medically necessary.  Stable for years.  Disc grief issues with brother and shock at his death.    No changes indicated.  Disc SE.  FU 1 years  Meredith Staggers, MD, DFAPA   Please see After Visit Summary for patient specific instructions.  No future appointments.  No orders of  the defined types were placed in this encounter.     -------------------------------

## 2019-11-23 ENCOUNTER — Other Ambulatory Visit: Payer: Self-pay | Admitting: Psychiatry

## 2019-11-23 DIAGNOSIS — F319 Bipolar disorder, unspecified: Secondary | ICD-10-CM

## 2019-11-24 NOTE — Telephone Encounter (Signed)
Should already have on file

## 2019-11-25 NOTE — Telephone Encounter (Signed)
Already sent.

## 2020-02-21 DIAGNOSIS — G4733 Obstructive sleep apnea (adult) (pediatric): Secondary | ICD-10-CM | POA: Diagnosis not present

## 2020-02-21 DIAGNOSIS — I10 Essential (primary) hypertension: Secondary | ICD-10-CM | POA: Diagnosis not present

## 2020-02-21 DIAGNOSIS — F319 Bipolar disorder, unspecified: Secondary | ICD-10-CM | POA: Diagnosis not present

## 2020-02-21 DIAGNOSIS — N529 Male erectile dysfunction, unspecified: Secondary | ICD-10-CM | POA: Diagnosis not present

## 2020-02-29 DIAGNOSIS — E781 Pure hyperglyceridemia: Secondary | ICD-10-CM | POA: Diagnosis not present

## 2020-02-29 DIAGNOSIS — I10 Essential (primary) hypertension: Secondary | ICD-10-CM | POA: Diagnosis not present

## 2020-02-29 DIAGNOSIS — Z125 Encounter for screening for malignant neoplasm of prostate: Secondary | ICD-10-CM | POA: Diagnosis not present

## 2020-05-09 DIAGNOSIS — N529 Male erectile dysfunction, unspecified: Secondary | ICD-10-CM | POA: Diagnosis not present

## 2020-05-21 DIAGNOSIS — S300XXA Contusion of lower back and pelvis, initial encounter: Secondary | ICD-10-CM | POA: Diagnosis not present

## 2020-06-01 ENCOUNTER — Other Ambulatory Visit: Payer: Self-pay | Admitting: Psychiatry

## 2020-06-01 DIAGNOSIS — F319 Bipolar disorder, unspecified: Secondary | ICD-10-CM

## 2020-06-04 ENCOUNTER — Other Ambulatory Visit: Payer: Self-pay | Admitting: Psychiatry

## 2020-06-04 MED ORDER — DIVALPROEX SODIUM 500 MG PO DR TAB: DELAYED_RELEASE_TABLET | ORAL | 0 refills | Status: DC

## 2020-06-12 ENCOUNTER — Ambulatory Visit: Payer: BC Managed Care – PPO | Admitting: Psychiatry

## 2020-06-17 DIAGNOSIS — G5602 Carpal tunnel syndrome, left upper limb: Secondary | ICD-10-CM | POA: Diagnosis not present

## 2020-06-25 ENCOUNTER — Ambulatory Visit: Payer: BC Managed Care – PPO | Admitting: Psychiatry

## 2020-07-03 ENCOUNTER — Ambulatory Visit (INDEPENDENT_AMBULATORY_CARE_PROVIDER_SITE_OTHER): Payer: BC Managed Care – PPO | Admitting: Psychiatry

## 2020-07-03 ENCOUNTER — Encounter: Payer: Self-pay | Admitting: Psychiatry

## 2020-07-03 ENCOUNTER — Other Ambulatory Visit: Payer: Self-pay

## 2020-07-03 DIAGNOSIS — F319 Bipolar disorder, unspecified: Secondary | ICD-10-CM

## 2020-07-03 NOTE — Progress Notes (Signed)
Joseph Kirk [id number] 07-31-1970 50 y.o.     Subjective:   Patient ID:  Joseph Kirk is a 50 y.o. (DOB [birth year]) male.  Chief Complaint:  Chief Complaint  Patient presents with  . Follow-up  . Medication Problem    HPI Joseph Kirk presents to the office today for follow-up of bipolar disorder.   Last seen March 2021.  He was doing well and no meds were changed.  07/04/19 appt noted: Lost B and his dog.  No marriage and no kids.  B left her $100K and it has affected his life.  Paid off his condo and has no debt.  Realizes it does not bring joy.  Weird emotional place in life.  Don't know what to do with his life.  Drives a truck.    Still claustrophobic with masks.  Goes to gym consistently.  Wants to wean off meds and see how he does without it.  Sister advises against it. Problems sexually and thinks meds might be related.  Saw a urologist.  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     Doing fine , same old thing.  No unusual mood swings.  Patient reports stable mood and denies depressed or irritable moods.  Patient denies any recent difficulty with anxiety.  Patient denies difficulty with sleep initiation or maintenance. Denies appetite disturbance.  But can only sleep 6-7 hours.  Patient reports that energy and motivation have been good.  Patient denies any difficulty with concentration.  Patient denies any suicidal ideation.  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   1998 was really depressed.  Has friends and family now that might help him recognize sx.  Don't drink alcohol.   Past Psychiatric Medication Trials:  Depakote brand necessary, Lamictal, risperidone No history of psych hospitalization.  Review of Systems:  Review of Systems  Cardiovascular: Negative for palpitations.  Neurological: Negative for tremors and weakness.  Psychiatric/Behavioral: Negative for agitation, behavioral problems, confusion,  decreased concentration, dysphoric mood, hallucinations, self-injury, sleep disturbance and suicidal ideas. The patient is not nervous/anxious and is not hyperactive.     Medications: I have reviewed the patient's current medications.  Current Outpatient Medications  Medication Sig Dispense Refill  . aluminum chloride (DRYSOL) 20 % external solution Apply topically at bedtime. USE AS DIRECTED    . diltiazem (CARDIZEM CD) 180 MG 24 hr capsule Take 180 mg by mouth daily.    . divalproex (DEPAKOTE) 500 MG DR tablet 2 tablets in the AM and 3 tablets at night 150 tablet 0  . losartan (COZAAR) 100 MG tablet Take 100 mg by mouth daily.    . quinapril-hydrochlorothiazide (ACCURETIC) 20-12.5 MG tablet Take 1 tablet by mouth daily.    . sildenafil (VIAGRA) 100 MG tablet Take 100 mg by mouth daily as needed. For ED     No current facility-administered medications for this visit.    Medication Side Effects: None  Allergies:  Allergies  Allergen Reactions  . Viagra [Sildenafil Citrate] Other (See Comments)    HEADACHES    Past Medical History:  Diagnosis Date  . Allergy   . Bipolar disorder (HCC)   . Hypertension   . Panic attacks   . Sleep apnea     Family History  Problem Relation Age of Onset  . Heart disease Father   . Heart failure Father   . Hypertension Father   . Heart disease  Paternal Grandfather   . Heart failure Paternal Grandfather   . Heart attack Paternal Grandfather   . Hypertension Paternal Grandfather   . Hypertension Sister     Social History   Socioeconomic History  . Marital status: Single    Spouse name: Not on file  . Number of children: Not on file  . Years of education: Not on file  . Highest education level: Not on file  Occupational History  . Not on file  Tobacco Use  . Smoking status: Never Smoker  . Smokeless tobacco: Never Used  Vaping Use  . Vaping Use: Never used  Substance and Sexual Activity  . Alcohol use: No  . Drug use: No  .  Sexual activity: Not on file  Other Topics Concern  . Not on file  Social History Narrative  . Not on file   Social Determinants of Health   Financial Resource Strain: Not on file  Food Insecurity: Not on file  Transportation Needs: Not on file  Physical Activity: Not on file  Stress: Not on file  Social Connections: Not on file  Intimate Partner Violence: Not on file    Past Medical History, Surgical history, Social history, and Family history were reviewed and updated as appropriate.   Still works FT.  Please see review of systems for further details on the patient's review from today.   Objective:   Physical Exam:  There were no vitals taken for this visit.  Physical Exam Constitutional:      General: He is not in acute distress.    Appearance: He is well-developed.  Musculoskeletal:        General: No deformity.  Neurological:     Mental Status: He is alert and oriented to person, place, and time.     Coordination: Coordination normal.  Psychiatric:        Attention and Perception: Attention normal. He is attentive.        Mood and Affect: Mood normal. Mood is not anxious or depressed. Affect is not labile, blunt, angry or inappropriate.        Speech: Speech normal.        Behavior: Behavior normal.        Thought Content: Thought content normal. Thought content does not include homicidal or suicidal ideation. Thought content does not include homicidal or suicidal plan.        Cognition and Memory: Cognition normal.        Judgment: Judgment normal.     Comments: Insight is good. Animated style long term.     Lab Review:  No results found for: NA, K, CL, CO2, GLUCOSE, BUN, CREATININE, CALCIUM, PROT, ALBUMIN, AST, ALT, ALKPHOS, BILITOT, GFRNONAA, GFRAA  No results found for: WBC, RBC, HGB, HCT, PLT, MCV, MCH, MCHC, RDW, LYMPHSABS, MONOABS, EOSABS, BASOSABS  No results found for: POCLITH, LITHIUM   No results found for: PHENYTOIN, PHENOBARB, VALPROATE, CBMZ    .res Assessment: Plan:    Bipolar I disorder (HCC)   Stable for years.   Greater than 50% of face to face time with patient was spent on counseling and coordination of care. Extensive discussion around patient's desire to wean off of mood stabilizer.  He was diagnosed with bipolar disorder years ago by Dr. Tiajuana Amass.  He denies a history of severe manic episodes and specifically has never had psychotic episodes.  He remembers more distinctly episodes of depression.  We discussed the research that supports that often patients with bipolar disorder  do not remember the significance and severity of manic episodes and that they can have significant consequence if they recur.  They can even be life threatening or can cause severe personal hardship through impulsive reckless behavior.  He believes that he can wean off the medication and recognizes manic or hypomanic episodes if they occur and seek treatment if necessary.  He was encouraged and agrees to share with a friend and a family member's desire to wean off medication and will meet with him on a regular basis to observe whether he has any recurrence of mood symptoms as he does so.  Patient was educated about the nature of bipolar disorder and if the diagnosis is accurate statistically he is likely to have recurrent mood episodes if he weans off the Depakote.  He wants to do so regardless of this information.  We discussed specifically his concerns about potential sexual side effects of Depakote which are low but not impossible.  We discussed potential alternative mood stabilizers to Depakote if necessary.  Per his request, he will reduce the Depakote by 500 mg monthly and will contact us if he has any recurrence of symptoms.  We will follow-up towards the end of his taper schedule.  He understands the risk of seizures if he stops abruptly.  Also extensively discussed issues around his inheritance which has relieved him of financial burden  but also created some existential issues around a sense of meaning and purpose and whether he should seek a career change.  His concerns were rational and well presented and he will continue to work on these areas.  Follow-up 3 to 4 months or sooner as needed This was a 40-minute appointment  Meredith Staggers, MD, DFAPA   Please see After Visit Summary for patient specific instructions.  Future Appointments  Date Time Provider Department Center  10/30/2020  9:00 AM Cottle, Steva Ready., MD [facility] None    No orders of the defined types were placed in this encounter.     -------------------------------

## 2020-07-11 DIAGNOSIS — M25532 Pain in left wrist: Secondary | ICD-10-CM | POA: Diagnosis not present

## 2020-07-11 DIAGNOSIS — N529 Male erectile dysfunction, unspecified: Secondary | ICD-10-CM | POA: Diagnosis not present

## 2020-07-11 DIAGNOSIS — M25531 Pain in right wrist: Secondary | ICD-10-CM | POA: Diagnosis not present

## 2020-08-27 ENCOUNTER — Telehealth: Payer: Self-pay

## 2020-08-27 ENCOUNTER — Other Ambulatory Visit: Payer: Self-pay | Admitting: Psychiatry

## 2020-08-27 DIAGNOSIS — F319 Bipolar disorder, unspecified: Secondary | ICD-10-CM

## 2020-08-27 NOTE — Telephone Encounter (Signed)
Check on refill °

## 2020-08-27 NOTE — Telephone Encounter (Signed)
Pt does not need Rx he is no longer on depakote.

## 2020-08-29 NOTE — Telephone Encounter (Signed)
Jola Babinski could you write a brief note that says as of his appointment in January the patient and his physician agreed that he could wean off the Depakote.  He is no longer on any psychiatric medication.

## 2020-08-30 DIAGNOSIS — M25531 Pain in right wrist: Secondary | ICD-10-CM | POA: Diagnosis not present

## 2020-08-30 DIAGNOSIS — M25532 Pain in left wrist: Secondary | ICD-10-CM | POA: Diagnosis not present

## 2020-08-30 NOTE — Telephone Encounter (Signed)
Thank you :)

## 2020-09-03 DIAGNOSIS — M25532 Pain in left wrist: Secondary | ICD-10-CM | POA: Diagnosis not present

## 2020-10-09 DIAGNOSIS — G5602 Carpal tunnel syndrome, left upper limb: Secondary | ICD-10-CM | POA: Diagnosis not present

## 2020-10-30 ENCOUNTER — Other Ambulatory Visit: Payer: Self-pay

## 2020-10-30 ENCOUNTER — Encounter: Payer: Self-pay | Admitting: Psychiatry

## 2020-10-30 ENCOUNTER — Ambulatory Visit (INDEPENDENT_AMBULATORY_CARE_PROVIDER_SITE_OTHER): Payer: BC Managed Care – PPO | Admitting: Psychiatry

## 2020-10-30 DIAGNOSIS — F39 Unspecified mood [affective] disorder: Secondary | ICD-10-CM

## 2020-10-30 NOTE — Progress Notes (Signed)
Cross Jorge [id number] 1970-09-18 50 y.o.     Subjective:   Patient ID:  Joseph Kirk is a 50 y.o. (DOB Dec 02, 1970) male.  Chief Complaint:  Chief Complaint  Patient presents with  . Follow-up  . Bipolar I disorder [facility])    HPI Joseph Kirk presents to the office today for follow-up of bipolar disorder.   seen March 2021.  He was doing well and no meds were changed.  07/03/20 appt noted: Lost B and his dog.  No marriage and no kids.  B left her $100K and it has affected his life.  Paid off his condo and has no debt.  Realizes it does not bring joy.  Weird emotional place in life.  Don't know what to do with his life.  Drives a truck.    Still claustrophobic with masks.  Goes to gym consistently. Wants to wean off meds and see how he does without it.  Sister advises against it. Problems sexually and thinks meds might be related.  Saw a urologist.  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    Plan per his request wean off Depakote  10/30/2020 appt noted: Slowly weaned off med and last night first night without meds.  No mood swings.  Sister aware. Best friend and family knows he weaned off meds.  No concerns from him. Saw urologist.    Still works out a lot. No differences positive or negative off the VPA including sexually. Denies history of mania.   Doing fine , same old thing.  No unusual mood swings.  Patient reports stable mood and denies depressed or irritable moods.  Patient denies any recent difficulty with anxiety.  Patient denies difficulty with sleep initiation or maintenance. Denies appetite disturbance.  But can only sleep 6-7 hours.  Patient reports that energy and motivation have been good.  Patient denies any difficulty with concentration.  Patient denies any suicidal ideation.  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   1998 was really depressed.  Has friends and family now that might help him recognize sx.   Don't drink alcohol.   Past Psychiatric Medication Trials:  Depakote brand necessary, Lamictal, risperidone No history of psych hospitalization.  Review of Systems:  Review of Systems  Neurological: Negative for tremors and weakness.  Psychiatric/Behavioral: Negative for agitation, behavioral problems, confusion, decreased concentration, dysphoric mood, hallucinations, self-injury, sleep disturbance and suicidal ideas. The patient is not nervous/anxious and is not hyperactive.     Medications: I have reviewed the patient's current medications.  Current Outpatient Medications  Medication Sig Dispense Refill  . aluminum chloride (DRYSOL) 20 % external solution Apply topically at bedtime. USE AS DIRECTED    . diltiazem (CARDIZEM CD) 180 MG 24 hr capsule Take 180 mg by mouth daily.    Marland Kitchen losartan (COZAAR) 100 MG tablet Take 100 mg by mouth daily.    . quinapril-hydrochlorothiazide (ACCURETIC) 20-12.5 MG tablet Take 1 tablet by mouth daily.    . sildenafil (VIAGRA) 100 MG tablet Take 100 mg by mouth daily as needed. For ED    . divalproex (DEPAKOTE) 500 MG DR tablet 2 tablets in the AM and 3 tablets at night (Patient not taking: Reported on 10/30/2020) 150 tablet 0   No current facility-administered medications for this visit.    Medication Side Effects: None  Allergies:  Allergies  Allergen Reactions  . Viagra [Sildenafil Citrate] Other (See Comments)  HEADACHES    Past Medical History:  Diagnosis Date  . Allergy   . Bipolar disorder (HCC)   . Hypertension   . Panic attacks   . Sleep apnea     Family History  Problem Relation Age of Onset  . Heart disease Father   . Heart failure Father   . Hypertension Father   . Heart disease Paternal Grandfather   . Heart failure Paternal Grandfather   . Heart attack Paternal Grandfather   . Hypertension Paternal Grandfather   . Hypertension Sister     Social History   Socioeconomic History  . Marital status: Single     Spouse name: Not on file  . Number of children: Not on file  . Years of education: Not on file  . Highest education level: Not on file  Occupational History  . Not on file  Tobacco Use  . Smoking status: Never Smoker  . Smokeless tobacco: Never Used  Vaping Use  . Vaping Use: Never used  Substance and Sexual Activity  . Alcohol use: No  . Drug use: No  . Sexual activity: Not on file  Other Topics Concern  . Not on file  Social History Narrative  . Not on file   Social Determinants of Health   Financial Resource Strain: Not on file  Food Insecurity: Not on file  Transportation Needs: Not on file  Physical Activity: Not on file  Stress: Not on file  Social Connections: Not on file  Intimate Partner Violence: Not on file    Past Medical History, Surgical history, Social history, and Family history were reviewed and updated as appropriate.   Still works FT.  Please see review of systems for further details on the patient's review from today.   Objective:   Physical Exam:  There were no vitals taken for this visit.  Physical Exam Constitutional:      General: He is not in acute distress.    Appearance: He is well-developed.  Musculoskeletal:        General: No deformity.  Neurological:     Mental Status: He is alert and oriented to person, place, and time.     Coordination: Coordination normal.  Psychiatric:        Attention and Perception: Attention normal. He is attentive.        Mood and Affect: Mood normal. Mood is not anxious or depressed. Affect is not labile, blunt, angry or inappropriate.        Speech: Speech normal.        Behavior: Behavior normal.        Thought Content: Thought content normal. Thought content does not include homicidal or suicidal ideation. Thought content does not include homicidal or suicidal plan.        Cognition and Memory: Cognition normal.        Judgment: Judgment normal.     Comments: Insight is good. Animated style long  term. No mania.     Lab Review:  No results found for: NA, K, CL, CO2, GLUCOSE, BUN, CREATININE, CALCIUM, PROT, ALBUMIN, AST, ALT, ALKPHOS, BILITOT, GFRNONAA, GFRAA  No results found for: WBC, RBC, HGB, HCT, PLT, MCV, MCH, MCHC, RDW, LYMPHSABS, MONOABS, EOSABS, BASOSABS  No results found for: POCLITH, LITHIUM   No results found for: PHENYTOIN, PHENOBARB, VALPROATE, CBMZ   .res Assessment: Plan:    Episodic mood disorder (HCC)   Stable for years.   Greater than 50% of face to face time with patient was  spent on counseling and coordination of care. Extensive discussion around patient's desire to wean off of mood stabilizer.  He was diagnosed with bipolar disorder years ago by Dr. Tiajuana Amass.  He denies a history of severe manic episodes and specifically has never had psychotic episodes.  He remembers more distinctly episodes of depression.  We discussed the research that supports that often patients with bipolar disorder do not remember the significance and severity of manic episodes and that they can have significant consequence if they recur.  They can even be life threatening or can cause severe personal hardship through impulsive reckless behavior.  He believes that he can wean off the medication and recognizes manic or hypomanic episodes if they occur and seek treatment if necessary.  He was encouraged and agrees to share with a friend and a family member's desire to wean off medication and will meet with him on a regular basis to observe whether he has any recurrence of mood symptoms as he does so.  He thinks when had sx in past it was situational.  Patient was educated about the nature of bipolar disorder and if the diagnosis is accurate statistically he is likely to have recurrent mood episodes off the Depakote.  He wants to do so regardless of this information.  He's had no problems off the Depakote.  We discussed potential alternative mood stabilizers to Depakote if  necessary.  Follow-up as needed.  Disc signs sx bipolar and depression and call if needed.  Meredith Staggers, MD, DFAPA   Please see After Visit Summary for patient specific instructions.  No future appointments.  No orders of the defined types were placed in this encounter.     -------------------------------

## 2020-11-21 DIAGNOSIS — Z4789 Encounter for other orthopedic aftercare: Secondary | ICD-10-CM | POA: Diagnosis not present

## 2020-11-21 DIAGNOSIS — M65332 Trigger finger, left middle finger: Secondary | ICD-10-CM | POA: Diagnosis not present

## 2020-12-25 DIAGNOSIS — M25532 Pain in left wrist: Secondary | ICD-10-CM | POA: Diagnosis not present

## 2020-12-25 DIAGNOSIS — M65332 Trigger finger, left middle finger: Secondary | ICD-10-CM | POA: Diagnosis not present

## 2021-01-28 DIAGNOSIS — N529 Male erectile dysfunction, unspecified: Secondary | ICD-10-CM | POA: Diagnosis not present

## 2021-01-30 DIAGNOSIS — N4833 Priapism, drug-induced: Secondary | ICD-10-CM | POA: Diagnosis not present

## 2021-01-30 DIAGNOSIS — Z79899 Other long term (current) drug therapy: Secondary | ICD-10-CM | POA: Diagnosis not present

## 2021-01-30 DIAGNOSIS — I10 Essential (primary) hypertension: Secondary | ICD-10-CM | POA: Diagnosis not present

## 2021-01-30 DIAGNOSIS — G4733 Obstructive sleep apnea (adult) (pediatric): Secondary | ICD-10-CM | POA: Diagnosis not present

## 2021-01-30 DIAGNOSIS — N4830 Priapism, unspecified: Secondary | ICD-10-CM | POA: Diagnosis not present

## 2021-01-30 DIAGNOSIS — Z888 Allergy status to other drugs, medicaments and biological substances status: Secondary | ICD-10-CM | POA: Diagnosis not present

## 2021-01-31 DIAGNOSIS — N4830 Priapism, unspecified: Secondary | ICD-10-CM | POA: Diagnosis not present

## 2021-02-06 DIAGNOSIS — N529 Male erectile dysfunction, unspecified: Secondary | ICD-10-CM | POA: Diagnosis not present

## 2021-02-13 DIAGNOSIS — H8113 Benign paroxysmal vertigo, bilateral: Secondary | ICD-10-CM | POA: Diagnosis not present

## 2021-02-19 DIAGNOSIS — I10 Essential (primary) hypertension: Secondary | ICD-10-CM | POA: Diagnosis not present

## 2021-02-19 DIAGNOSIS — E781 Pure hyperglyceridemia: Secondary | ICD-10-CM | POA: Diagnosis not present

## 2021-02-19 DIAGNOSIS — Z0000 Encounter for general adult medical examination without abnormal findings: Secondary | ICD-10-CM | POA: Diagnosis not present

## 2021-04-13 DIAGNOSIS — N4833 Priapism, drug-induced: Secondary | ICD-10-CM | POA: Diagnosis not present

## 2021-04-13 DIAGNOSIS — N4830 Priapism, unspecified: Secondary | ICD-10-CM | POA: Diagnosis not present

## 2021-04-13 DIAGNOSIS — G4730 Sleep apnea, unspecified: Secondary | ICD-10-CM | POA: Diagnosis not present

## 2021-04-13 DIAGNOSIS — I10 Essential (primary) hypertension: Secondary | ICD-10-CM | POA: Diagnosis not present

## 2021-04-13 DIAGNOSIS — Z888 Allergy status to other drugs, medicaments and biological substances status: Secondary | ICD-10-CM | POA: Diagnosis not present

## 2021-04-13 DIAGNOSIS — Z79899 Other long term (current) drug therapy: Secondary | ICD-10-CM | POA: Diagnosis not present

## 2021-04-13 DIAGNOSIS — T50995A Adverse effect of other drugs, medicaments and biological substances, initial encounter: Secondary | ICD-10-CM | POA: Diagnosis not present

## 2021-04-13 DIAGNOSIS — R9431 Abnormal electrocardiogram [ECG] [EKG]: Secondary | ICD-10-CM | POA: Diagnosis not present

## 2021-05-22 DIAGNOSIS — M25512 Pain in left shoulder: Secondary | ICD-10-CM | POA: Diagnosis not present

## 2021-06-14 DIAGNOSIS — Z1211 Encounter for screening for malignant neoplasm of colon: Secondary | ICD-10-CM | POA: Diagnosis not present

## 2021-08-06 DIAGNOSIS — N4830 Priapism, unspecified: Secondary | ICD-10-CM | POA: Diagnosis not present

## 2021-08-06 DIAGNOSIS — G4730 Sleep apnea, unspecified: Secondary | ICD-10-CM | POA: Diagnosis not present

## 2021-08-06 DIAGNOSIS — N4889 Other specified disorders of penis: Secondary | ICD-10-CM | POA: Diagnosis not present

## 2021-08-06 DIAGNOSIS — I10 Essential (primary) hypertension: Secondary | ICD-10-CM | POA: Diagnosis not present

## 2021-08-06 DIAGNOSIS — Z791 Long term (current) use of non-steroidal anti-inflammatories (NSAID): Secondary | ICD-10-CM | POA: Diagnosis not present

## 2021-08-06 DIAGNOSIS — Z79899 Other long term (current) drug therapy: Secondary | ICD-10-CM | POA: Diagnosis not present

## 2021-08-06 DIAGNOSIS — Z9989 Dependence on other enabling machines and devices: Secondary | ICD-10-CM | POA: Diagnosis not present

## 2021-08-06 DIAGNOSIS — Z888 Allergy status to other drugs, medicaments and biological substances status: Secondary | ICD-10-CM | POA: Diagnosis not present

## 2021-08-09 DIAGNOSIS — N4830 Priapism, unspecified: Secondary | ICD-10-CM | POA: Diagnosis not present

## 2021-08-09 DIAGNOSIS — N529 Male erectile dysfunction, unspecified: Secondary | ICD-10-CM | POA: Diagnosis not present

## 2021-09-24 DIAGNOSIS — S29012A Strain of muscle and tendon of back wall of thorax, initial encounter: Secondary | ICD-10-CM | POA: Diagnosis not present

## 2021-11-11 DIAGNOSIS — N400 Enlarged prostate without lower urinary tract symptoms: Secondary | ICD-10-CM | POA: Diagnosis not present

## 2021-11-11 DIAGNOSIS — N529 Male erectile dysfunction, unspecified: Secondary | ICD-10-CM | POA: Diagnosis not present

## 2021-11-28 DIAGNOSIS — N529 Male erectile dysfunction, unspecified: Secondary | ICD-10-CM | POA: Diagnosis not present

## 2021-12-02 DIAGNOSIS — M79645 Pain in left finger(s): Secondary | ICD-10-CM | POA: Diagnosis not present

## 2022-03-04 DIAGNOSIS — M79645 Pain in left finger(s): Secondary | ICD-10-CM | POA: Diagnosis not present

## 2022-03-27 DIAGNOSIS — Z125 Encounter for screening for malignant neoplasm of prostate: Secondary | ICD-10-CM | POA: Diagnosis not present

## 2022-03-27 DIAGNOSIS — Z0000 Encounter for general adult medical examination without abnormal findings: Secondary | ICD-10-CM | POA: Diagnosis not present

## 2022-03-27 DIAGNOSIS — Z23 Encounter for immunization: Secondary | ICD-10-CM | POA: Diagnosis not present

## 2022-03-27 DIAGNOSIS — E781 Pure hyperglyceridemia: Secondary | ICD-10-CM | POA: Diagnosis not present

## 2022-03-27 DIAGNOSIS — I10 Essential (primary) hypertension: Secondary | ICD-10-CM | POA: Diagnosis not present

## 2022-05-21 DIAGNOSIS — R059 Cough, unspecified: Secondary | ICD-10-CM | POA: Diagnosis not present

## 2022-05-21 DIAGNOSIS — J208 Acute bronchitis due to other specified organisms: Secondary | ICD-10-CM | POA: Diagnosis not present

## 2022-05-21 DIAGNOSIS — B9689 Other specified bacterial agents as the cause of diseases classified elsewhere: Secondary | ICD-10-CM | POA: Diagnosis not present

## 2022-05-21 DIAGNOSIS — Z20822 Contact with and (suspected) exposure to covid-19: Secondary | ICD-10-CM | POA: Diagnosis not present

## 2022-05-21 DIAGNOSIS — J019 Acute sinusitis, unspecified: Secondary | ICD-10-CM | POA: Diagnosis not present

## 2022-06-08 DIAGNOSIS — J208 Acute bronchitis due to other specified organisms: Secondary | ICD-10-CM | POA: Diagnosis not present

## 2022-06-08 DIAGNOSIS — B9689 Other specified bacterial agents as the cause of diseases classified elsewhere: Secondary | ICD-10-CM | POA: Diagnosis not present

## 2022-06-08 DIAGNOSIS — R062 Wheezing: Secondary | ICD-10-CM | POA: Diagnosis not present

## 2022-06-17 DIAGNOSIS — M65332 Trigger finger, left middle finger: Secondary | ICD-10-CM | POA: Diagnosis not present

## 2022-06-20 DIAGNOSIS — Z23 Encounter for immunization: Secondary | ICD-10-CM | POA: Diagnosis not present

## 2022-08-29 DIAGNOSIS — M25511 Pain in right shoulder: Secondary | ICD-10-CM | POA: Diagnosis not present

## 2022-09-26 DIAGNOSIS — Z1211 Encounter for screening for malignant neoplasm of colon: Secondary | ICD-10-CM | POA: Diagnosis not present

## 2022-10-01 DIAGNOSIS — M25511 Pain in right shoulder: Secondary | ICD-10-CM | POA: Diagnosis not present

## 2022-10-07 DIAGNOSIS — M75121 Complete rotator cuff tear or rupture of right shoulder, not specified as traumatic: Secondary | ICD-10-CM | POA: Diagnosis not present

## 2022-10-07 DIAGNOSIS — M19011 Primary osteoarthritis, right shoulder: Secondary | ICD-10-CM | POA: Diagnosis not present

## 2022-10-07 DIAGNOSIS — M75111 Incomplete rotator cuff tear or rupture of right shoulder, not specified as traumatic: Secondary | ICD-10-CM | POA: Diagnosis not present

## 2022-10-07 DIAGNOSIS — M67921 Unspecified disorder of synovium and tendon, right upper arm: Secondary | ICD-10-CM | POA: Diagnosis not present

## 2022-10-07 DIAGNOSIS — M24111 Other articular cartilage disorders, right shoulder: Secondary | ICD-10-CM | POA: Diagnosis not present

## 2022-10-17 DIAGNOSIS — M25511 Pain in right shoulder: Secondary | ICD-10-CM | POA: Diagnosis not present

## 2022-11-10 DIAGNOSIS — M25511 Pain in right shoulder: Secondary | ICD-10-CM | POA: Diagnosis not present

## 2022-11-11 DIAGNOSIS — M67813 Other specified disorders of tendon, right shoulder: Secondary | ICD-10-CM | POA: Diagnosis not present

## 2022-11-11 DIAGNOSIS — M75121 Complete rotator cuff tear or rupture of right shoulder, not specified as traumatic: Secondary | ICD-10-CM | POA: Diagnosis not present

## 2022-11-11 DIAGNOSIS — S43081A Other subluxation of right shoulder joint, initial encounter: Secondary | ICD-10-CM | POA: Diagnosis not present

## 2022-11-11 DIAGNOSIS — M7541 Impingement syndrome of right shoulder: Secondary | ICD-10-CM | POA: Diagnosis not present

## 2022-11-11 DIAGNOSIS — S46011A Strain of muscle(s) and tendon(s) of the rotator cuff of right shoulder, initial encounter: Secondary | ICD-10-CM | POA: Diagnosis not present

## 2022-11-11 DIAGNOSIS — M898X1 Other specified disorders of bone, shoulder: Secondary | ICD-10-CM | POA: Diagnosis not present

## 2022-11-11 DIAGNOSIS — M24211 Disorder of ligament, right shoulder: Secondary | ICD-10-CM | POA: Diagnosis not present

## 2022-11-11 DIAGNOSIS — G8918 Other acute postprocedural pain: Secondary | ICD-10-CM | POA: Diagnosis not present

## 2022-11-11 DIAGNOSIS — M19011 Primary osteoarthritis, right shoulder: Secondary | ICD-10-CM | POA: Diagnosis not present

## 2022-11-11 DIAGNOSIS — M75111 Incomplete rotator cuff tear or rupture of right shoulder, not specified as traumatic: Secondary | ICD-10-CM | POA: Diagnosis not present

## 2022-11-11 DIAGNOSIS — M24111 Other articular cartilage disorders, right shoulder: Secondary | ICD-10-CM | POA: Diagnosis not present

## 2022-11-18 DIAGNOSIS — M25511 Pain in right shoulder: Secondary | ICD-10-CM | POA: Diagnosis not present

## 2022-11-18 DIAGNOSIS — R6889 Other general symptoms and signs: Secondary | ICD-10-CM | POA: Diagnosis not present

## 2022-11-26 DIAGNOSIS — R6889 Other general symptoms and signs: Secondary | ICD-10-CM | POA: Diagnosis not present

## 2022-11-26 DIAGNOSIS — M25511 Pain in right shoulder: Secondary | ICD-10-CM | POA: Diagnosis not present

## 2022-12-01 DIAGNOSIS — R6889 Other general symptoms and signs: Secondary | ICD-10-CM | POA: Diagnosis not present

## 2022-12-01 DIAGNOSIS — M25511 Pain in right shoulder: Secondary | ICD-10-CM | POA: Diagnosis not present

## 2022-12-08 DIAGNOSIS — M25511 Pain in right shoulder: Secondary | ICD-10-CM | POA: Diagnosis not present

## 2022-12-08 DIAGNOSIS — R6889 Other general symptoms and signs: Secondary | ICD-10-CM | POA: Diagnosis not present

## 2022-12-15 DIAGNOSIS — R6889 Other general symptoms and signs: Secondary | ICD-10-CM | POA: Diagnosis not present

## 2022-12-15 DIAGNOSIS — M25511 Pain in right shoulder: Secondary | ICD-10-CM | POA: Diagnosis not present

## 2022-12-23 DIAGNOSIS — R6889 Other general symptoms and signs: Secondary | ICD-10-CM | POA: Diagnosis not present

## 2022-12-23 DIAGNOSIS — M25511 Pain in right shoulder: Secondary | ICD-10-CM | POA: Diagnosis not present

## 2022-12-29 DIAGNOSIS — M25511 Pain in right shoulder: Secondary | ICD-10-CM | POA: Diagnosis not present

## 2022-12-29 DIAGNOSIS — R6889 Other general symptoms and signs: Secondary | ICD-10-CM | POA: Diagnosis not present

## 2023-01-05 DIAGNOSIS — M25511 Pain in right shoulder: Secondary | ICD-10-CM | POA: Diagnosis not present

## 2023-01-05 DIAGNOSIS — R6889 Other general symptoms and signs: Secondary | ICD-10-CM | POA: Diagnosis not present

## 2023-01-12 DIAGNOSIS — R6889 Other general symptoms and signs: Secondary | ICD-10-CM | POA: Diagnosis not present

## 2023-01-12 DIAGNOSIS — M25511 Pain in right shoulder: Secondary | ICD-10-CM | POA: Diagnosis not present

## 2023-03-09 DIAGNOSIS — Z4789 Encounter for other orthopedic aftercare: Secondary | ICD-10-CM | POA: Diagnosis not present

## 2023-03-09 DIAGNOSIS — M25511 Pain in right shoulder: Secondary | ICD-10-CM | POA: Diagnosis not present

## 2023-03-24 DIAGNOSIS — B9689 Other specified bacterial agents as the cause of diseases classified elsewhere: Secondary | ICD-10-CM | POA: Diagnosis not present

## 2023-03-24 DIAGNOSIS — R0602 Shortness of breath: Secondary | ICD-10-CM | POA: Diagnosis not present

## 2023-03-24 DIAGNOSIS — R062 Wheezing: Secondary | ICD-10-CM | POA: Diagnosis not present

## 2023-03-24 DIAGNOSIS — R051 Acute cough: Secondary | ICD-10-CM | POA: Diagnosis not present

## 2023-03-24 DIAGNOSIS — J208 Acute bronchitis due to other specified organisms: Secondary | ICD-10-CM | POA: Diagnosis not present

## 2023-04-02 DIAGNOSIS — Z125 Encounter for screening for malignant neoplasm of prostate: Secondary | ICD-10-CM | POA: Diagnosis not present

## 2023-04-02 DIAGNOSIS — Z0000 Encounter for general adult medical examination without abnormal findings: Secondary | ICD-10-CM | POA: Diagnosis not present

## 2023-04-02 DIAGNOSIS — I10 Essential (primary) hypertension: Secondary | ICD-10-CM | POA: Diagnosis not present

## 2023-04-02 DIAGNOSIS — K760 Fatty (change of) liver, not elsewhere classified: Secondary | ICD-10-CM | POA: Diagnosis not present

## 2023-04-02 DIAGNOSIS — E781 Pure hyperglyceridemia: Secondary | ICD-10-CM | POA: Diagnosis not present

## 2023-04-13 DIAGNOSIS — M25511 Pain in right shoulder: Secondary | ICD-10-CM | POA: Diagnosis not present

## 2023-05-11 DIAGNOSIS — N289 Disorder of kidney and ureter, unspecified: Secondary | ICD-10-CM | POA: Diagnosis not present

## 2023-05-11 DIAGNOSIS — N179 Acute kidney failure, unspecified: Secondary | ICD-10-CM | POA: Diagnosis not present

## 2023-05-29 NOTE — Progress Notes (Unsigned)
CARDIOLOGY CONSULT NOTE       Patient ID: Joseph Kirk MRN: [id number] DOB/AGE: August 30, 1970 52 y.o.  Referring Physician: Manus Gunning Primary Physician: Blair Heys, MD (Inactive) Primary Cardiologist: New Reason for Consultation: CAD Risk    HPI:  62 y.o. referred by [facility] primary for CAD risk. Last seen by [place] in 2018. History of Bipolar now off meds. Sees psychiatry. CRF;s HTN and family history with grandfather premature CAD and father died of CHF . OSA uses CPAP Drives a truck for living. TTE done 2014 EF 50-55% mild MR mild LAE and septal hypertrophy 14.6 mm Repeat 12/04/16 EF 55-60% mild MR septum 11 mm   He is active exercises and lifts weights Takes some "supplements" Local trucking  Wears CPAP of OSA   Cardiac CTA 01/01/17 reviewed calcium score only 4 punctate plaque in mid LAD and distal LCX no obstructive dx right dominant cors  HTN Rx with cardizem and ARB in past Prior ECG;s with LVH inferior lateral T wave changes   Brother died of MI "widow maker" hiking at [employer] He was healthy and very athletic and this has bothered patient a lot.     ROS All other systems reviewed and negative except as noted above  Past Medical History:  Diagnosis Date   Allergy    Bipolar disorder (HCC)    Hypertension    Panic attacks    Sleep apnea     Family History  Problem Relation Age of Onset   Heart disease Father    Heart failure Father    Hypertension Father    Heart disease Paternal Grandfather    Heart failure Paternal Grandfather    Heart attack Paternal Grandfather    Hypertension Paternal Grandfather    Hypertension Sister     Social History   Socioeconomic History   Marital status: Single    Spouse name: Not on file   Number of children: Not on file   Years of education: Not on file   Highest education level: Not on file  Occupational History   Not on file  Tobacco Use   Smoking status: Never   Smokeless tobacco: Never  Vaping Use   Vaping  status: Never Used  Substance and Sexual Activity   Alcohol use: No   Drug use: No   Sexual activity: Not on file  Other Topics Concern   Not on file  Social History Narrative   Not on file   Social Drivers of Health   Financial Resource Strain: Not on file  Food Insecurity: No Food Insecurity (08/09/2021)   Received from [facility]   Hunger Vital Sign    Worried About Running Out of Food in the Last Year: Never true    Ran Out of Food in the Last Year: Never true  Transportation Needs: Not on file  Physical Activity: Not on file  Stress: Not on file  Social Connections: Unknown (10/29/2021)   [facility]   Social Network    Social Network: Not on file  Intimate Partner Violence: Unknown (09/23/2021)   Received from [facility]   HITS    Physically Hurt: Not on file    Insult or Talk Down To: Not on file    Threaten Physical Harm: Not on file    Scream or Curse: Not on file    Past Surgical History:  Procedure Laterality Date   ELBOW SURGERY Left    SHOULDER SURGERY  2002      Current  Outpatient Medications:    aluminum chloride (DRYSOL) 20 % external solution, Apply topically at bedtime. USE AS DIRECTED, Disp: , Rfl:    diltiazem (CARDIZEM CD) 180 MG 24 hr capsule, Take 180 mg by mouth daily., Disp: , Rfl:    losartan (COZAAR) 100 MG tablet, Take 100 mg by mouth daily., Disp: , Rfl:    quinapril-hydrochlorothiazide (ACCURETIC) 20-12.5 MG tablet, Take 1 tablet by mouth daily., Disp: , Rfl:    sildenafil (VIAGRA) 100 MG tablet, Take 100 mg by mouth daily as needed. For ED, Disp: , Rfl:     Physical Exam: Blood pressure 130/78, pulse 78, height 6' 0.5" (1.842 m), weight 264 lb (119.7 kg), SpO2 98%.    Affect appropriate Healthy:  appears stated age HEENT: normal Neck supple with no adenopathy JVP normal no bruits no thyromegaly Lungs clear with no wheezing and good diaphragmatic motion Heart:  S1/S2 no murmur, no rub, gallop or click PMI  normal Abdomen: benighn, BS positve, no tenderness, no AAA no bruit.  No HSM or HJR Distal pulses intact with no bruits No edema Neuro non-focal Skin warm and dry No muscular weakness   Labs:  No results found for: "WBC", "HGB", "HCT", "MCV", "PLT" No results for input(s): "NA", "K", "CL", "CO2", "BUN", "CREATININE", "CALCIUM", "PROT", "BILITOT", "ALKPHOS", "ALT", "AST", "GLUCOSE" in the last 168 hours.  Invalid input(s): "LABALBU" No results found for: "CKTOTAL", "CKMB", "CKMBINDEX", "TROPONINI" No results found for: "CHOL" No results found for: "HDL" No results found for: "LDLCALC" No results found for: "TRIG" No results found for: "CHOLHDL" No results found for: "LDLDIRECT"    Radiology: No results found.  EKG: SR rate 81 voltage LVH in limb leads insignificant inferior Q waves    ASSESSMENT AND PLAN:   CAD: calcium score 4, but 79 th percentile for age 52 Drives a truck ECG abnormal with LVH strain. Will update calcium score and echo Consider EX Myovue if anything abnormal  HTN:  has LVH with strain on ECG see above TTE  Bipolar:  stable off meds  OSA:  continue CPAP  TTE Calcium score   F/U in a year  Signed: Charlton Haws 06/04/2023, 10:56 AM

## 2023-06-04 ENCOUNTER — Ambulatory Visit: Payer: BC Managed Care – PPO | Attending: Cardiovascular Disease | Admitting: Cardiovascular Disease

## 2023-06-04 ENCOUNTER — Encounter: Payer: Self-pay | Admitting: Cardiovascular Disease

## 2023-06-04 VITALS — BP 130/78 | HR 78 | Ht 72.5 in | Wt 264.0 lb

## 2023-06-04 DIAGNOSIS — I10 Essential (primary) hypertension: Secondary | ICD-10-CM

## 2023-06-04 DIAGNOSIS — I2510 Atherosclerotic heart disease of native coronary artery without angina pectoris: Secondary | ICD-10-CM

## 2023-06-04 DIAGNOSIS — R9431 Abnormal electrocardiogram [ECG] [EKG]: Secondary | ICD-10-CM | POA: Diagnosis not present

## 2023-06-04 NOTE — Patient Instructions (Signed)
Medication Instructions:  Your physician recommends that you continue on your current medications as directed. Please refer to the Current Medication list given to you today.  *If you need a refill on your cardiac medications before your next appointment, please call your pharmacy*  Lab Work: If you have labs (blood work) drawn today and your tests are completely normal, you will receive your results only by: MyChart Message (if you have MyChart) OR A paper copy in the mail If you have any lab test that is abnormal or we need to change your treatment, we will call you to review the results.   Testing/Procedures: Your physician has requested that you have an echocardiogram. Echocardiography is a painless test that uses sound waves to create images of your heart. It provides your doctor with information about the size and shape of your heart and how well your heart's chambers and valves are working. This procedure takes approximately one hour. There are no restrictions for this procedure. Please do NOT wear cologne, perfume, aftershave, or lotions (deodorant is allowed). Please arrive 15 minutes prior to your appointment time.  Please note: We ask at that you not bring children with you during ultrasound (echo/ vascular) testing. Due to room size and safety concerns, children are not allowed in the ultrasound rooms during exams. Our front office staff cannot provide observation of children in our lobby area while testing is being conducted. An adult accompanying a patient to their appointment will only be allowed in the ultrasound room at the discretion of the ultrasound technician under special circumstances. We apologize for any inconvenience. CT scanning for a cardiac calcium score (CAT scanning), is a noninvasive, special x-ray that produces cross-sectional images of the body using x-rays and a computer. CT scans help physicians diagnose and treat medical conditions. For some CT exams, a contrast  material is used to enhance visibility in the area of the body being studied. CT scans provide greater clarity and reveal more details than regular x-ray exams.   Follow-Up: At [facility], you and your health needs are our priority.  As part of our continuing mission to provide you with exceptional heart care, we have created designated Provider Care Teams.  These Care Teams include your primary Cardiologist (physician) and Advanced Practice Providers (APPs -  Physician Assistants and Nurse Practitioners) who all work together to provide you with the care you need, when you need it.  We recommend signing up for the patient portal called "MyChart".  Sign up information is provided on this After Visit Summary.  MyChart is used to connect with patients for Virtual Visits (Telemedicine).  Patients are able to view lab/test results, encounter notes, upcoming appointments, etc.  Non-urgent messages can be sent to your provider as well.   To learn more about what you can do with MyChart, go to [email or web address].    Your next appointment:   1 year(s)  Provider:   Charlton Haws, MD

## 2023-06-12 ENCOUNTER — Ambulatory Visit (HOSPITAL_COMMUNITY)
Admission: RE | Admit: 2023-06-12 | Discharge: 2023-06-12 | Disposition: A | Discharge status: Home / Self Care | Payer: Self-pay | Source: Ambulatory Visit | Attending: Cardiovascular Disease | Admitting: Cardiovascular Disease

## 2023-06-12 DIAGNOSIS — I2510 Atherosclerotic heart disease of native coronary artery without angina pectoris: Secondary | ICD-10-CM | POA: Insufficient documentation

## 2023-06-12 DIAGNOSIS — I10 Essential (primary) hypertension: Secondary | ICD-10-CM | POA: Insufficient documentation

## 2023-06-12 DIAGNOSIS — R9431 Abnormal electrocardiogram [ECG] [EKG]: Secondary | ICD-10-CM | POA: Insufficient documentation

## 2023-06-12 DIAGNOSIS — M25562 Pain in left knee: Secondary | ICD-10-CM | POA: Diagnosis not present

## 2023-06-19 ENCOUNTER — Ambulatory Visit (HOSPITAL_BASED_OUTPATIENT_CLINIC_OR_DEPARTMENT_OTHER): Payer: BC Managed Care – PPO

## 2023-06-19 DIAGNOSIS — I10 Essential (primary) hypertension: Secondary | ICD-10-CM | POA: Diagnosis not present

## 2023-06-19 DIAGNOSIS — R9431 Abnormal electrocardiogram [ECG] [EKG]: Secondary | ICD-10-CM | POA: Diagnosis not present

## 2023-06-19 DIAGNOSIS — I2510 Atherosclerotic heart disease of native coronary artery without angina pectoris: Secondary | ICD-10-CM

## 2023-06-19 LAB — ECHOCARDIOGRAM COMPLETE
AR max vel: 2.9 cm2
AV Area VTI: 2.56 cm2
AV Area mean vel: 2.74 cm2
AV Mean grad: 10 mm[Hg]
AV Peak grad: 18.3 mm[Hg]
Ao pk vel: 2.14 m/s
Area-P 1/2: 4.12 cm2
P 1/2 time: 540 ms
S' Lateral: 4.21 cm

## 2023-07-22 ENCOUNTER — Telehealth: Payer: Self-pay

## 2023-07-22 DIAGNOSIS — I351 Nonrheumatic aortic (valve) insufficiency: Secondary | ICD-10-CM

## 2023-07-22 NOTE — Telephone Encounter (Signed)
-----   Message from Charlton Haws sent at 06/20/2023  8:52 AM EST ----- EF normal AV sclerosis with mild/mod AR f/u echo in a  year

## 2023-07-22 NOTE — Telephone Encounter (Signed)
Will place order for echo.

## 2023-09-15 DIAGNOSIS — R059 Cough, unspecified: Secondary | ICD-10-CM | POA: Diagnosis not present

## 2023-09-15 DIAGNOSIS — I10 Essential (primary) hypertension: Secondary | ICD-10-CM | POA: Diagnosis not present

## 2023-09-15 DIAGNOSIS — E781 Pure hyperglyceridemia: Secondary | ICD-10-CM | POA: Diagnosis not present

## 2023-09-15 DIAGNOSIS — G4733 Obstructive sleep apnea (adult) (pediatric): Secondary | ICD-10-CM | POA: Diagnosis not present

## 2023-10-21 DIAGNOSIS — R053 Chronic cough: Secondary | ICD-10-CM | POA: Diagnosis not present

## 2023-11-06 DIAGNOSIS — S76012A Strain of muscle, fascia and tendon of left hip, initial encounter: Secondary | ICD-10-CM | POA: Diagnosis not present

## 2023-11-06 DIAGNOSIS — M25552 Pain in left hip: Secondary | ICD-10-CM | POA: Diagnosis not present

## 2023-11-13 DIAGNOSIS — R945 Abnormal results of liver function studies: Secondary | ICD-10-CM | POA: Diagnosis not present

## 2023-11-13 DIAGNOSIS — R799 Abnormal finding of blood chemistry, unspecified: Secondary | ICD-10-CM | POA: Diagnosis not present

## 2023-11-25 DIAGNOSIS — R053 Chronic cough: Secondary | ICD-10-CM | POA: Diagnosis not present

## 2023-12-18 DIAGNOSIS — M25552 Pain in left hip: Secondary | ICD-10-CM | POA: Diagnosis not present

## 2023-12-30 DIAGNOSIS — S46392A Other injury of muscle, fascia and tendon of triceps, left arm, initial encounter: Secondary | ICD-10-CM | POA: Diagnosis not present

## 2023-12-30 DIAGNOSIS — M25522 Pain in left elbow: Secondary | ICD-10-CM | POA: Diagnosis not present

## 2023-12-30 DIAGNOSIS — M25551 Pain in right hip: Secondary | ICD-10-CM | POA: Diagnosis not present

## 2024-01-18 DIAGNOSIS — M25551 Pain in right hip: Secondary | ICD-10-CM | POA: Diagnosis not present

## 2024-01-18 DIAGNOSIS — M25552 Pain in left hip: Secondary | ICD-10-CM | POA: Diagnosis not present

## 2024-01-25 DIAGNOSIS — M7022 Olecranon bursitis, left elbow: Secondary | ICD-10-CM | POA: Diagnosis not present

## 2024-01-25 DIAGNOSIS — S56512A Strain of other extensor muscle, fascia and tendon at forearm level, left arm, initial encounter: Secondary | ICD-10-CM | POA: Diagnosis not present

## 2024-01-25 DIAGNOSIS — M67932 Unspecified disorder of synovium and tendon, left forearm: Secondary | ICD-10-CM | POA: Diagnosis not present

## 2024-02-01 DIAGNOSIS — S76012A Strain of muscle, fascia and tendon of left hip, initial encounter: Secondary | ICD-10-CM | POA: Diagnosis not present

## 2024-03-02 ENCOUNTER — Ambulatory Visit (INDEPENDENT_AMBULATORY_CARE_PROVIDER_SITE_OTHER): Admitting: Behavioral Health

## 2024-03-09 DIAGNOSIS — M25552 Pain in left hip: Secondary | ICD-10-CM | POA: Diagnosis not present

## 2024-03-09 DIAGNOSIS — R936 Abnormal findings on diagnostic imaging of limbs: Secondary | ICD-10-CM | POA: Diagnosis not present

## 2024-03-09 DIAGNOSIS — S76012A Strain of muscle, fascia and tendon of left hip, initial encounter: Secondary | ICD-10-CM | POA: Diagnosis not present

## 2024-03-23 ENCOUNTER — Encounter: Payer: Self-pay | Admitting: Cardiovascular Disease

## 2024-04-04 DIAGNOSIS — E781 Pure hyperglyceridemia: Secondary | ICD-10-CM | POA: Diagnosis not present

## 2024-04-04 DIAGNOSIS — Z125 Encounter for screening for malignant neoplasm of prostate: Secondary | ICD-10-CM | POA: Diagnosis not present

## 2024-04-04 DIAGNOSIS — R945 Abnormal results of liver function studies: Secondary | ICD-10-CM | POA: Diagnosis not present

## 2024-04-04 DIAGNOSIS — I10 Essential (primary) hypertension: Secondary | ICD-10-CM | POA: Diagnosis not present

## 2024-04-15 DIAGNOSIS — S76012D Strain of muscle, fascia and tendon of left hip, subsequent encounter: Secondary | ICD-10-CM | POA: Diagnosis not present

## 2024-04-28 DIAGNOSIS — M25522 Pain in left elbow: Secondary | ICD-10-CM | POA: Diagnosis not present

## 2024-04-28 DIAGNOSIS — M66822 Spontaneous rupture of other tendons, left upper arm: Secondary | ICD-10-CM | POA: Diagnosis not present

## 2024-04-28 DIAGNOSIS — S46312A Strain of muscle, fascia and tendon of triceps, left arm, initial encounter: Secondary | ICD-10-CM | POA: Diagnosis not present

## 2024-04-28 DIAGNOSIS — G8918 Other acute postprocedural pain: Secondary | ICD-10-CM | POA: Diagnosis not present

## 2024-04-28 DIAGNOSIS — M778 Other enthesopathies, not elsewhere classified: Secondary | ICD-10-CM | POA: Diagnosis not present

## 2024-05-23 ENCOUNTER — Other Ambulatory Visit (HOSPITAL_COMMUNITY)

## 2024-06-09 DIAGNOSIS — M5412 Radiculopathy, cervical region: Secondary | ICD-10-CM | POA: Diagnosis not present

## 2024-06-20 ENCOUNTER — Ambulatory Visit (HOSPITAL_COMMUNITY)
Admission: RE | Admit: 2024-06-20 | Discharge: 2024-06-20 | Disposition: A | Discharge status: Home / Self Care | Source: Ambulatory Visit | Attending: Cardiovascular Disease | Admitting: Cardiovascular Disease

## 2024-06-20 DIAGNOSIS — I351 Nonrheumatic aortic (valve) insufficiency: Secondary | ICD-10-CM | POA: Insufficient documentation

## 2024-06-20 LAB — ECHOCARDIOGRAM COMPLETE
AR max vel: 3.29 cm2
AV Area VTI: 3.36 cm2
AV Area mean vel: 3.25 cm2
AV Mean grad: 14 mmHg
AV Peak grad: 25 mmHg
Ao pk vel: 2.5 m/s
Area-P 1/2: 3.39 cm2
P 1/2 time: 338 ms
S' Lateral: 4.3 cm

## 2024-06-21 ENCOUNTER — Ambulatory Visit: Payer: Self-pay | Admitting: Cardiovascular Disease

## 2024-07-06 NOTE — Progress Notes (Signed)
 CARDIOLOGY CONSULT NOTE       Patient ID: Joseph Kirk MRN: [id number] DOB/AGE: 09-22-1970 54 y.o.  Referring Physician: Hugh Primary Physician: Joseph Cole, MD Primary Cardiologist: New Reason for Consultation: CAD Risk    HPI:  52 y.o. referred by [facility] primary for CAD risk. Last seen by us  in 2018. History of Bipolar now off meds. Sees psychiatry. CRF;s HTN and family history with grandfather premature CAD and father died of CHF . OSA uses CPAP Drives a truck for living. TTE done 2014 EF 50-55% mild MR mild LAE and septal hypertrophy 14.6 mm Repeat 12/04/16 EF 55-60% mild MR septum 11 mm   He is active exercises and lifts weights Takes some supplements Local trucking  Wears CPAP of OSA   Cardiac CTA 01/01/17 reviewed calcium  score only 4 punctate plaque in mid LAD and distal LCX no obstructive dx right dominant cors  HTN Rx with cardizem and ARB in past Prior ECG;s with LVH inferior lateral T wave changes   Brother died of MI widow maker hiking at [employer] He was healthy and very athletic and this has bothered patient a lot.   TTE 06/20/24 showed EF 55-60% mild LVH Mild MR, and Mild AR with mild AS mean gradient 14 peak 25 DVI 0.63 but AVA over 3 cm2 but LVOT diameter used was very large at 2.6 cm Ascending aorta dilated at 3.0 cm  Valve appears tri leaflet with partial fusion of right/left cusps   Calcium  score done 06/15/23 remains low at 7.13, 34 th percentile The ascending aorta was reported as 3.7 but I measured 4.0 cm   Since I last saw him had left  distal triceps repair and osteoplasty 04/28/24   Still driving a [employer] locally Home everynight. Still using supplements for his work outs Lots of questions. Explained that since his LDL > 100 should be on statin. He wanted an NMR and LpA which is reasonable.    ROS All other systems reviewed and negative except as noted above  Past Medical History:  Diagnosis Date   Allergy    Bipolar disorder (HCC)     Hypertension    Panic attacks    Sleep apnea     Family History  Problem Relation Age of Onset   Heart disease Father    Heart failure Father    Hypertension Father    Heart disease Paternal Grandfather    Heart failure Paternal Grandfather    Heart attack Paternal Grandfather    Hypertension Paternal Grandfather    Hypertension Sister     Social History   Socioeconomic History   Marital status: Single    Spouse name: Not on file   Number of children: Not on file   Years of education: Not on file   Highest education level: Not on file  Occupational History   Not on file  Tobacco Use   Smoking status: Never   Smokeless tobacco: Never  Vaping Use   Vaping status: Never Used  Substance and Sexual Activity   Alcohol use: No   Drug use: No   Sexual activity: Not on file  Other Topics Concern   Not on file  Social History Narrative   Not on file   Social Drivers of Health   Tobacco Use: Low Risk (07/15/2024)   Received from [facility]   Patient History    Smoking Tobacco Use: Never    Smokeless Tobacco Use: Never    Passive Exposure: Not on file  Financial  Resource Strain: Not on file  Food Insecurity: No Food Insecurity (08/09/2021)   Received from [facility]   Epic    Within the past 12 months, you worried that your food would run out before you got the money to buy more.: Never true    Within the past 12 months, the food you bought just didn't last and you didn't have money to get more.: Never true  Transportation Needs: Not on file  Physical Activity: Not on file  Stress: Not on file  Social Connections: Not on file  Intimate Partner Violence: Not on file  Depression (EYV7-0): Not on file  Alcohol Screen: Not on file  Housing: Not on file  Utilities: Not on file  Health Literacy: Not on file    Past Surgical History:  Procedure Laterality Date   ELBOW SURGERY Left    SHOULDER SURGERY  2002      Current Outpatient Medications:    aluminum  chloride (DRYSOL) 20 % external solution, Apply topically at bedtime. USE AS DIRECTED, Disp: , Rfl:    diltiazem (CARDIZEM CD) 180 MG 24 hr capsule, Take 180 mg by mouth daily., Disp: , Rfl:    gabapentin (NEURONTIN) 100 MG capsule, Take by mouth 2 (two) times daily., Disp: , Rfl:    lisinopril-hydrochlorothiazide (ZESTORETIC) 20-12.5 MG tablet, Take 1 tablet by mouth daily., Disp: , Rfl:    losartan (COZAAR) 100 MG tablet, Take 100 mg by mouth daily., Disp: , Rfl:     Physical Exam: Blood pressure 122/69, pulse 70, height 6' 1 (1.854 m), weight 263 lb (119.3 kg), SpO2 97%.    Affect appropriate Healthy:  appears stated age HEENT: normal Neck supple with no adenopathy JVP normal no bruits no thyromegaly Lungs clear with no wheezing and good diaphragmatic motion Heart:  S1/S2 no murmur, no rub, gallop or click PMI normal Abdomen: benighn, BS positve, no tenderness, no AAA no bruit.  No HSM or HJR Distal pulses intact with no bruits No edema Scar over left olecranon    Radiology: ECHOCARDIOGRAM COMPLETE Result Date: 06/20/2024    ECHOCARDIOGRAM REPORT   Patient Name:   Joseph Kirk  Date of Exam: 06/20/2024 Medical Rec #:  [id number]     Height:       72.5 in Accession #:    [id number]    Weight:       264.0 lb Date of Birth:  [birth year]     BSA:          2.410 m Patient Age:    53 years      BP:           130/78 mmHg Patient Gender: M             HR:           81 bpm. Exam Location:  [address] Procedure: 2D Echo, Cardiac Doppler, Color Doppler, 3D Echo and Strain Analysis            (Both Spectral and Color Flow Doppler were utilized during            procedure). Indications:    Aortic regurgitation I35.1  History:        Patient has prior history of Echocardiogram examinations, most                 recent 06/19/2023. Risk Factors:Hypertension.  Sonographer:    Joseph Kirk RDCS Referring Phys: [id number] Joseph Kirk IMPRESSIONS  1. Left ventricular ejection fraction, by estimation,  is 55 to 60%. The left ventricle has normal function. The left ventricle has no regional wall motion abnormalities. There is mild concentric left ventricular hypertrophy. Left ventricular diastolic parameters were normal. The average left ventricular global longitudinal strain is -19.6 %. The global longitudinal strain is normal.  2. Right ventricular systolic function is normal. The right ventricular size is normal. There is normal pulmonary artery systolic pressure.  3. The mitral valve is normal in structure. Mild mitral valve regurgitation. No evidence of mitral stenosis.  4. The aortic valve is tricuspid. Aortic valve regurgitation is mild. Mild aortic valve stenosis. Aortic valve area, by VTI measures 3.36 cm. Aortic valve mean gradient measures 14.0 mmHg. Aortic valve Vmax measures 2.50 m/s.  5. Aortic dilatation noted. There is mild dilatation of the aortic root, measuring 40 mm. There is mild dilatation of the ascending aorta, measuring 43 mm.  6. The inferior vena cava is normal in size with greater than 50% respiratory variability, suggesting right atrial pressure of 3 mmHg. Comparison(s): Changes from prior study are noted. Mild aortic stenosis now present. Dilation of the ascending aorta now present. FINDINGS  Left Ventricle: Left ventricular ejection fraction, by estimation, is 55 to 60%. The left ventricle has normal function. The left ventricle has no regional wall motion abnormalities. The average left ventricular global longitudinal strain is -19.6 %. Strain was performed and the global longitudinal strain is normal. The left ventricular internal cavity size was normal in size. There is mild concentric left ventricular hypertrophy. Left ventricular diastolic parameters were normal. Right Ventricle: The right ventricular size is normal. No increase in right ventricular wall thickness. Right ventricular systolic function is normal. There is normal pulmonary artery systolic pressure. The tricuspid  regurgitant velocity is 2.52 m/s, and  with an assumed right atrial pressure of 3 mmHg, the estimated right ventricular systolic pressure is 28.4 mmHg. Left Atrium: Left atrial size was normal in size. Right Atrium: Right atrial size was normal in size. Pericardium: There is no evidence of pericardial effusion. Mitral Valve: The mitral valve is normal in structure. Mild mitral valve regurgitation. No evidence of mitral valve stenosis. Tricuspid Valve: The tricuspid valve is normal in structure. Tricuspid valve regurgitation is trivial. No evidence of tricuspid stenosis. Aortic Valve: The aortic valve is tricuspid. Aortic valve regurgitation is mild. Aortic regurgitation PHT measures 338 msec. Mild aortic stenosis is present. Aortic valve mean gradient measures 14.0 mmHg. Aortic valve peak gradient measures 25.0 mmHg. Aortic valve area, by VTI measures 3.36 cm. Pulmonic Valve: The pulmonic valve was normal in structure. Pulmonic valve regurgitation is not visualized. No evidence of pulmonic stenosis. Aorta: Aortic dilatation noted. There is mild dilatation of the aortic root, measuring 40 mm. There is mild dilatation of the ascending aorta, measuring 43 mm. Venous: The inferior vena cava is normal in size with greater than 50% respiratory variability, suggesting right atrial pressure of 3 mmHg. IAS/Shunts: No atrial level shunt detected by color flow Doppler.  LEFT VENTRICLE PLAX 2D LVIDd:         6.20 cm   Diastology LVIDs:         4.30 cm   LV e' medial:    8.59 cm/s LV PW:         1.10 cm   LV E/e' medial:  10.4 LV IVS:        1.10 cm   LV e' lateral:   8.49 cm/s LVOT diam:     2.60 cm   LV E/e' lateral:  10.6 LV SV:         160 LV SV Index:   66        2D Longitudinal Strain LVOT Area:     5.31 cm  2D Strain GLS (A4C):   -20.8 %                          2D Strain GLS (A3C):   -19.0 %                          2D Strain GLS (A2C):   -18.9 %                          2D Strain GLS Avg:     -19.6 %                            3D Volume EF:                          3D EF:        52 %                          LV EDV:       271 ml                          LV ESV:       129 ml                          LV SV:        142 ml RIGHT VENTRICLE             IVC RV Basal diam:  4.20 cm     IVC diam: 1.80 cm RV Mid diam:    3.50 cm RV S prime:     13.80 cm/s  PULMONARY VEINS TAPSE (M-mode): 2.4 cm      A Reversal Velocity: 34.00 cm/s                             Diastolic Velocity:  43.70 cm/s                             S/D Velocity:        1.40                             Systolic Velocity:   59.00 cm/s LEFT ATRIUM           Index        RIGHT ATRIUM           Index LA diam:      3.30 cm 1.37 cm/m   RA Area:     23.90 cm LA Vol (A4C): 48.2 ml 20.00 ml/m  RA Volume:   80.20 ml  33.28 ml/m  AORTIC VALVE AV Area (Vmax):    3.29 cm AV Area (Vmean):   3.25 cm AV Area (VTI):     3.36 cm AV Vmax:           250.00 cm/s AV Vmean:  175.000 cm/s AV VTI:            0.475 m AV Peak Grad:      25.0 mmHg AV Mean Grad:      14.0 mmHg LVOT Vmax:         155.00 cm/s LVOT Vmean:        107.000 cm/s LVOT VTI:          0.301 m LVOT/AV VTI ratio: 0.63 AI PHT:            338 msec  AORTA Ao Root diam: 4.00 cm Ao Asc diam:  4.30 cm MITRAL VALVE               TRICUSPID VALVE MV Area (PHT): 3.39 cm    TR Peak grad:   25.4 mmHg MV Decel Time: 224 msec    TR Vmax:        252.00 cm/s MV E velocity: 89.70 cm/s MV A velocity: 80.30 cm/s  SHUNTS MV E/A ratio:  1.12        Systemic VTI:  0.30 m                            Systemic Diam: 2.60 cm Georganna Archer Electronically signed by Georganna Archer Signature Date/Time: 06/20/2024/7:39:34 PM    Final     EKG: SR rate 81 voltage LVH in limb leads insignificant inferior Q waves    ASSESSMENT AND PLAN:   CAD: calcium  score 7, 34 th percentile 06/15/23 Percentile has dropped since 2018  HTN:  has LVH with strain on ECG see above Mild LVH on TTE 06/20/24 with septal thickness 11 mm  Bipolar:  stable off  meds  OSA:  continue CPAP Aorta/AV:  Mild AR and mean gradient 14 mmHg Ascending aorta 4.0 cm to my measurements. AV calcium  score only 640 Ortho:  post left olecranon / triceps repair 11/20205 improved Interestingly he was given a fairly novel pain med Journavx by Dr Inocencio that is non addictive Na blocker. NaV1.8 HLD:  labs he did on his own with LDL > 100 Start crestor  5 mg daily and repeat labs in 3 months Check NMR and LpA today   Gated chest CTA in a year  Echo in a year for AS/AR  Crestor  5 mg Lipid/Liver in 3 months   F/U in a year    Signed: Maude Kirk 07/19/2024, 9:30 AM

## 2024-07-19 ENCOUNTER — Ambulatory Visit: Payer: Self-pay | Admitting: Cardiovascular Disease

## 2024-07-19 VITALS — BP 122/69 | HR 70 | Ht 73.0 in | Wt 263.0 lb

## 2024-07-19 DIAGNOSIS — I10 Essential (primary) hypertension: Secondary | ICD-10-CM

## 2024-07-19 DIAGNOSIS — E785 Hyperlipidemia, unspecified: Secondary | ICD-10-CM

## 2024-07-19 DIAGNOSIS — I351 Nonrheumatic aortic (valve) insufficiency: Secondary | ICD-10-CM | POA: Diagnosis not present

## 2024-07-19 DIAGNOSIS — I2510 Atherosclerotic heart disease of native coronary artery without angina pectoris: Secondary | ICD-10-CM

## 2024-07-19 MED ORDER — ROSUVASTATIN CALCIUM 5 MG PO TABS: 5.0000 mg | ORAL_TABLET | Freq: Every day | ORAL | 3 refills | Status: AC

## 2024-07-19 NOTE — Patient Instructions (Signed)
 Medication Instructions:   START rosuvastatin  (crestor ) 5mg  once daily for cholesterol   *If you need a refill on your cardiac medications before your next appointment, please call your pharmacy*  Lab Work:  FASTING lab work - NMR lipoprofile and LPa    FASTING lab work in ~12 weeks to reassess cholesterol levels   If you have labs (blood work) drawn today and your tests are completely normal, you will receive your results only by: [employer] (if you have MyChart) OR A paper copy in the mail If you have any lab test that is abnormal or we need to change your treatment, we will call you to review the results.  Testing/Procedures: NONE  Follow-Up: At [facility], you and your health needs are our priority.  As part of our continuing mission to provide you with exceptional heart care, our providers are all part of one team.  This team includes your primary Cardiologist (physician) and Advanced Practice Providers or APPs (Physician Assistants and Nurse Practitioners) who all work together to provide you with the care you need, when you need it.  Your next appointment:    12 months with Dr. Delford   We recommend signing up for the patient portal called MyChart.  Sign up information is provided on this After Visit Summary.  MyChart is used to connect with patients for Virtual Visits (Telemedicine).  Patients are able to view lab/test results, encounter notes, upcoming appointments, etc.  Non-urgent messages can be sent to your provider as well.   To learn more about what you can do with MyChart, go to [email or web address].   Other Instructions

## 2024-07-20 ENCOUNTER — Ambulatory Visit: Payer: Self-pay | Admitting: Cardiovascular Disease

## 2024-07-20 LAB — NMR, LIPOPROFILE
Cholesterol, Total: 161 mg/dL (ref 100–199)
HDL Particle Number: 28.5 umol/L — ABNORMAL LOW
HDL-C: 36 mg/dL — ABNORMAL LOW
LDL Particle Number: 1080 nmol/L — ABNORMAL HIGH
LDL Size: 21.3 nm
LDL-C (NIH Calc): 107 mg/dL — ABNORMAL HIGH (ref 0–99)
LP-IR Score: 37
Small LDL Particle Number: 341 nmol/L
Triglycerides: 94 mg/dL (ref 0–149)

## 2024-07-20 LAB — LIPOPROTEIN A (LPA): Lipoprotein (a): 112.4 nmol/L — ABNORMAL HIGH
